# Patient Record
Sex: Female | Born: 1970 | Race: White | Hispanic: No | Marital: Married | Smoking: Current every day smoker
Health system: Southern US, Community
[De-identification: ages and names within clinical notes are randomized; demographics above are authoritative.]

## PROBLEM LIST (undated history)

## (undated) DIAGNOSIS — E119 Type 2 diabetes mellitus without complications: Secondary | ICD-10-CM

## (undated) DIAGNOSIS — E7800 Pure hypercholesterolemia, unspecified: Secondary | ICD-10-CM

## (undated) HISTORY — PX: ABDOMINAL HYSTERECTOMY: SHX81

## (undated) HISTORY — DX: Pure hypercholesterolemia, unspecified: E78.00

## (undated) HISTORY — DX: Type 2 diabetes mellitus without complications: E11.9

---

## 1997-12-10 ENCOUNTER — Other Ambulatory Visit: Admission: RE | Admit: 1997-12-10 | Discharge: 1997-12-10 | Payer: Self-pay | Admitting: Obstetrics and Gynecology

## 1998-12-28 ENCOUNTER — Other Ambulatory Visit: Admission: RE | Admit: 1998-12-28 | Discharge: 1998-12-28 | Payer: Self-pay | Admitting: Obstetrics and Gynecology

## 2000-08-01 ENCOUNTER — Other Ambulatory Visit: Admission: RE | Admit: 2000-08-01 | Discharge: 2000-08-01 | Payer: Self-pay | Admitting: Obstetrics and Gynecology

## 2001-10-04 ENCOUNTER — Other Ambulatory Visit: Admission: RE | Admit: 2001-10-04 | Discharge: 2001-10-04 | Payer: Self-pay | Admitting: Obstetrics and Gynecology

## 2001-10-11 ENCOUNTER — Ambulatory Visit (HOSPITAL_COMMUNITY): Admission: RE | Admit: 2001-10-11 | Discharge: 2001-10-11 | Payer: Self-pay | Admitting: Obstetrics and Gynecology

## 2001-10-22 ENCOUNTER — Inpatient Hospital Stay (HOSPITAL_COMMUNITY): Admission: AD | Admit: 2001-10-22 | Discharge: 2001-10-22 | Payer: Self-pay | Admitting: Obstetrics and Gynecology

## 2002-07-08 ENCOUNTER — Encounter: Payer: Self-pay | Admitting: Obstetrics and Gynecology

## 2002-07-08 ENCOUNTER — Encounter: Admission: RE | Admit: 2002-07-08 | Discharge: 2002-07-08 | Payer: Self-pay | Admitting: Obstetrics and Gynecology

## 2002-12-22 ENCOUNTER — Other Ambulatory Visit: Admission: RE | Admit: 2002-12-22 | Discharge: 2002-12-22 | Payer: Self-pay | Admitting: Obstetrics and Gynecology

## 2004-05-18 ENCOUNTER — Other Ambulatory Visit: Admission: RE | Admit: 2004-05-18 | Discharge: 2004-05-18 | Payer: Self-pay | Admitting: Obstetrics and Gynecology

## 2004-07-13 ENCOUNTER — Ambulatory Visit: Payer: Self-pay | Admitting: Cardiology

## 2004-10-11 ENCOUNTER — Ambulatory Visit: Payer: Self-pay | Admitting: Cardiology

## 2005-01-16 ENCOUNTER — Ambulatory Visit: Payer: Self-pay | Admitting: Cardiology

## 2005-02-08 ENCOUNTER — Ambulatory Visit: Payer: Self-pay | Admitting: Cardiology

## 2005-04-05 ENCOUNTER — Ambulatory Visit: Payer: Self-pay | Admitting: Cardiology

## 2005-06-15 ENCOUNTER — Other Ambulatory Visit: Admission: RE | Admit: 2005-06-15 | Discharge: 2005-06-15 | Payer: Self-pay | Admitting: Obstetrics and Gynecology

## 2005-07-26 ENCOUNTER — Ambulatory Visit: Payer: Self-pay | Admitting: Cardiology

## 2005-08-04 ENCOUNTER — Encounter: Payer: Self-pay | Admitting: Cardiology

## 2005-08-04 ENCOUNTER — Ambulatory Visit: Payer: Self-pay

## 2007-09-09 ENCOUNTER — Ambulatory Visit: Payer: Self-pay | Admitting: Cardiology

## 2007-10-07 ENCOUNTER — Encounter (INDEPENDENT_AMBULATORY_CARE_PROVIDER_SITE_OTHER): Payer: Self-pay | Admitting: Obstetrics and Gynecology

## 2007-10-07 ENCOUNTER — Ambulatory Visit (HOSPITAL_COMMUNITY): Admission: RE | Admit: 2007-10-07 | Discharge: 2007-10-08 | Payer: Self-pay | Admitting: Obstetrics and Gynecology

## 2007-10-21 ENCOUNTER — Ambulatory Visit: Payer: Self-pay | Admitting: Cardiology

## 2007-10-21 LAB — CONVERTED CEMR LAB
ALT: 28 units/L (ref 0–35)
AST: 23 units/L (ref 0–37)
Albumin: 3.4 g/dL — ABNORMAL LOW (ref 3.5–5.2)
Alkaline Phosphatase: 89 units/L (ref 39–117)
Bilirubin, Direct: 0.1 mg/dL (ref 0.0–0.3)
Cholesterol: 137 mg/dL (ref 0–200)
HDL: 26.8 mg/dL — ABNORMAL LOW (ref >39.0)
LDL Cholesterol: 96 mg/dL (ref 0–99)
Total Bilirubin: 0.4 mg/dL (ref 0.3–1.2)
Total CHOL/HDL Ratio: 5.1
Total Protein: 6.8 g/dL (ref 6.0–8.3)
Triglycerides: 73 mg/dL (ref 0–149)
VLDL: 15 mg/dL (ref 0–40)

## 2007-11-04 ENCOUNTER — Ambulatory Visit: Payer: Self-pay | Admitting: Cardiology

## 2007-11-14 ENCOUNTER — Ambulatory Visit (HOSPITAL_COMMUNITY): Admission: RE | Admit: 2007-11-14 | Discharge: 2007-11-14 | Payer: Self-pay | Admitting: Obstetrics and Gynecology

## 2008-03-04 ENCOUNTER — Ambulatory Visit: Payer: Self-pay | Admitting: Gastroenterology

## 2008-03-04 DIAGNOSIS — R1011 Right upper quadrant pain: Secondary | ICD-10-CM | POA: Insufficient documentation

## 2008-03-10 ENCOUNTER — Ambulatory Visit: Payer: Self-pay | Admitting: Gastroenterology

## 2008-04-02 ENCOUNTER — Ambulatory Visit: Payer: Self-pay | Admitting: Cardiology

## 2008-11-21 DIAGNOSIS — E1169 Type 2 diabetes mellitus with other specified complication: Secondary | ICD-10-CM | POA: Insufficient documentation

## 2008-11-21 DIAGNOSIS — N809 Endometriosis, unspecified: Secondary | ICD-10-CM | POA: Insufficient documentation

## 2008-11-21 DIAGNOSIS — E785 Hyperlipidemia, unspecified: Secondary | ICD-10-CM | POA: Insufficient documentation

## 2008-11-26 ENCOUNTER — Ambulatory Visit: Payer: Self-pay | Admitting: Cardiology

## 2008-11-26 ENCOUNTER — Encounter: Payer: Self-pay | Admitting: Cardiology

## 2008-11-30 ENCOUNTER — Ambulatory Visit: Payer: Self-pay | Admitting: Cardiology

## 2008-11-30 LAB — CONVERTED CEMR LAB
ALT: 20 units/L (ref 0–35)
AST: 23 units/L (ref 0–37)
Albumin: 3.7 g/dL (ref 3.5–5.2)
Alkaline Phosphatase: 95 units/L (ref 39–117)
Bilirubin, Direct: 0.1 mg/dL (ref 0.0–0.3)
Cholesterol: 117 mg/dL (ref 0–200)
HDL: 24.1 mg/dL — ABNORMAL LOW (ref >39.00)
Hgb A1c MFr Bld: 6.5 % (ref 4.6–6.5)
LDL Cholesterol: 82 mg/dL (ref 0–99)
Total Bilirubin: 0.6 mg/dL (ref 0.3–1.2)
Total CHOL/HDL Ratio: 5
Total Protein: 6.9 g/dL (ref 6.0–8.3)
Triglycerides: 56 mg/dL (ref 0.0–149.0)
VLDL: 11.2 mg/dL (ref 0.0–40.0)

## 2008-12-10 ENCOUNTER — Encounter (INDEPENDENT_AMBULATORY_CARE_PROVIDER_SITE_OTHER): Payer: Self-pay | Admitting: *Deleted

## 2009-03-04 ENCOUNTER — Encounter: Admission: RE | Admit: 2009-03-04 | Discharge: 2009-05-13 | Payer: Self-pay | Admitting: Specialist

## 2009-10-12 IMAGING — CT CT PELVIS W/O CM
2 of 4 series · 13 of 32 positions shown, 18 images · non-contrast
Comparison: None

CT ABDOMEN

CLINICAL DATA: evaluate for renal stones, ureteral obstuction, or
gallstones;

CT ABDOMEN AND PELVIS WITHOUT CONTRAST (CT UROGRAM)
TECHNIQUE: Multidetector CT imaging of the abdomen was performed
following the standard protocol without IV contrast.,Technique:
Multidetector CT imaging of the pelvis was performed following the
standard protocol without intravenous contrast.

[Series 2: abd pelvis · axial · 0.86mm/px · z∈[-459,-119]mm · 5 of 104 slices shown, 10 images]
[im 18/104  soft-tissue]
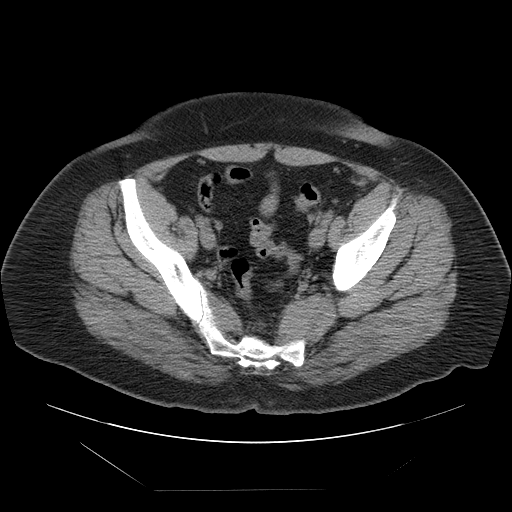
[im 18/104  bone]
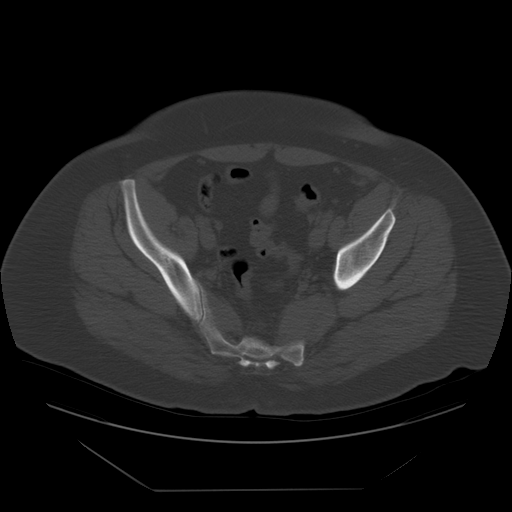
[im 35/104  soft-tissue]
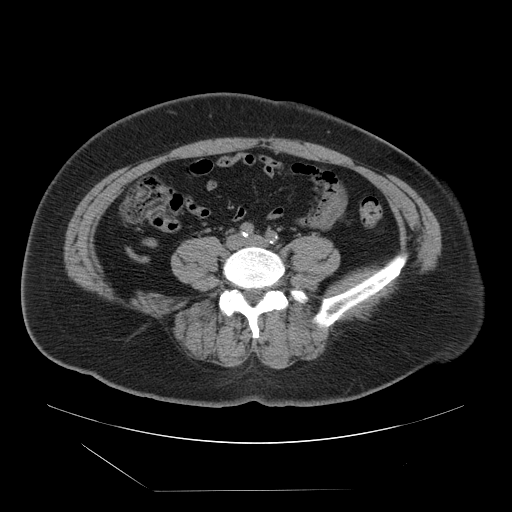
[im 35/104  lung]
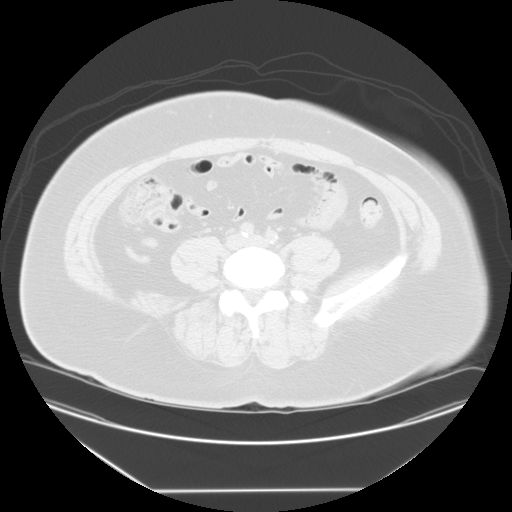
[im 52/104  soft-tissue]
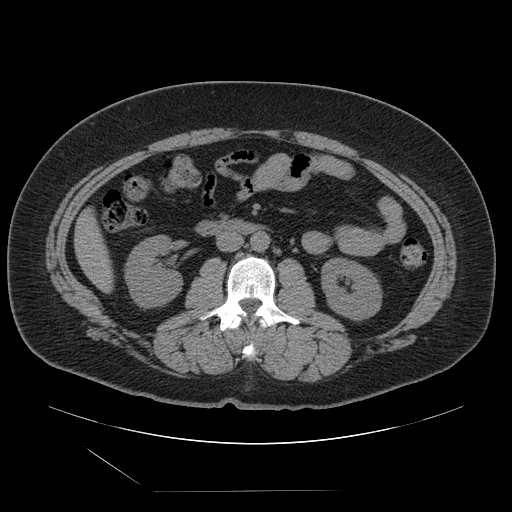
[im 52/104  lung]
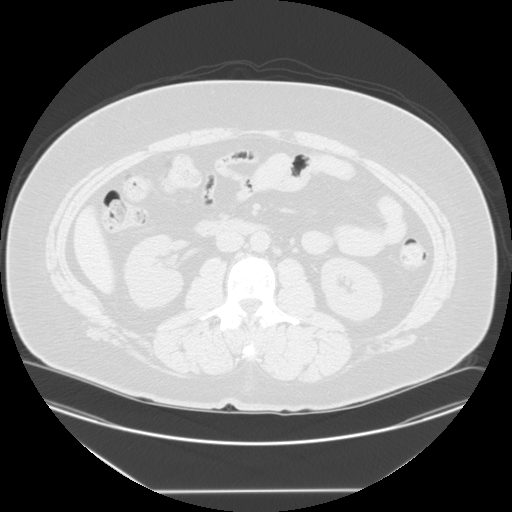
[im 69/104  soft-tissue]
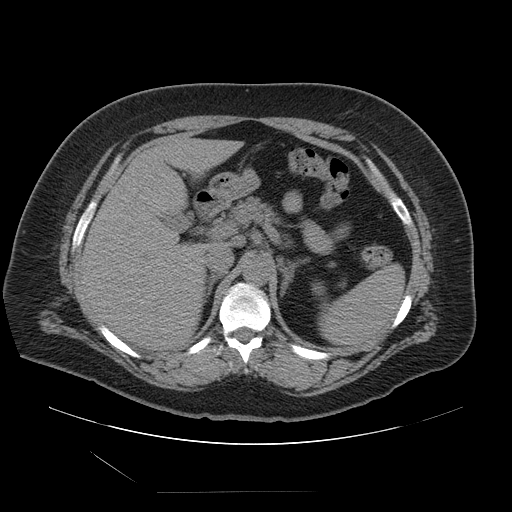
[im 69/104  lung]
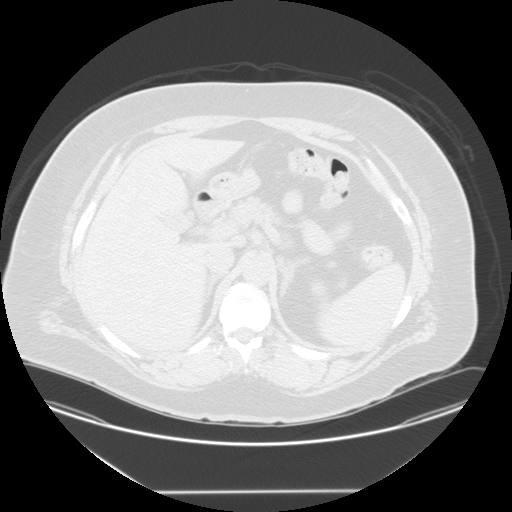
[im 86/104  soft-tissue]
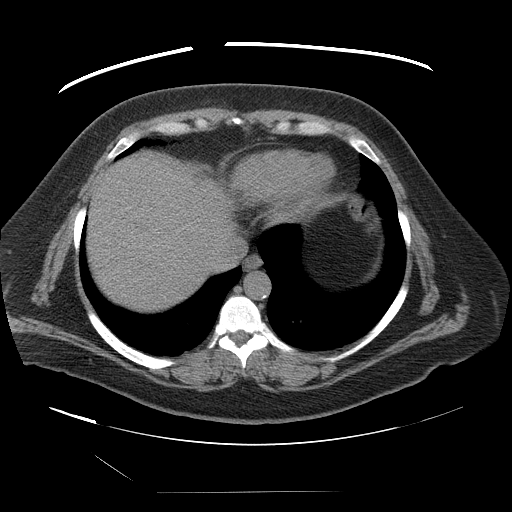
[im 86/104  lung]
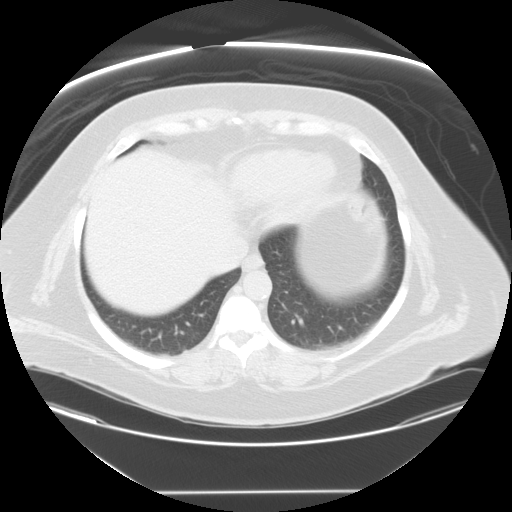

[Series 104: reformatted · sagittal · 0.98mm/px · 8 of 210 slices shown]
[im 17/210  soft-tissue]
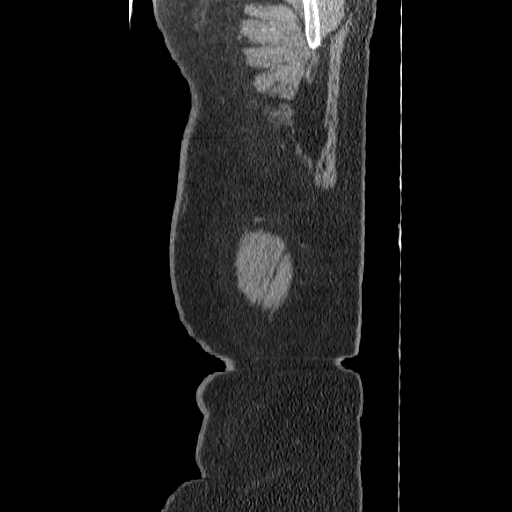
[im 49/210  soft-tissue]
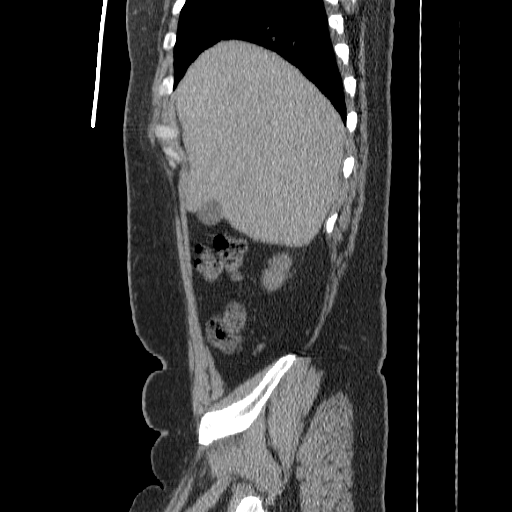
[im 65/210  soft-tissue]
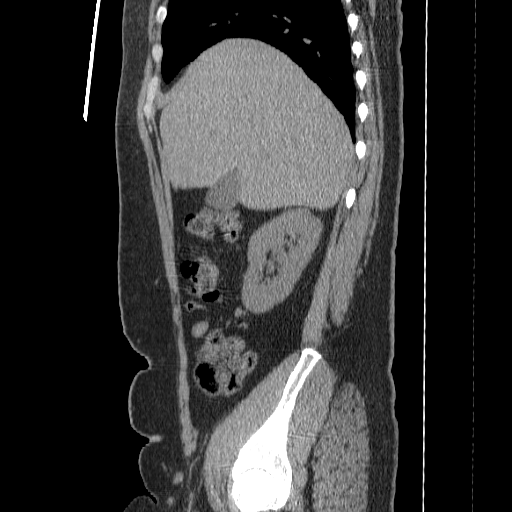
[im 97/210  soft-tissue]
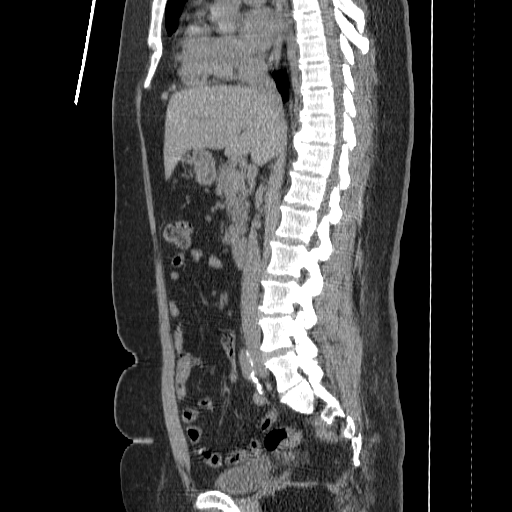
[im 113/210  soft-tissue]
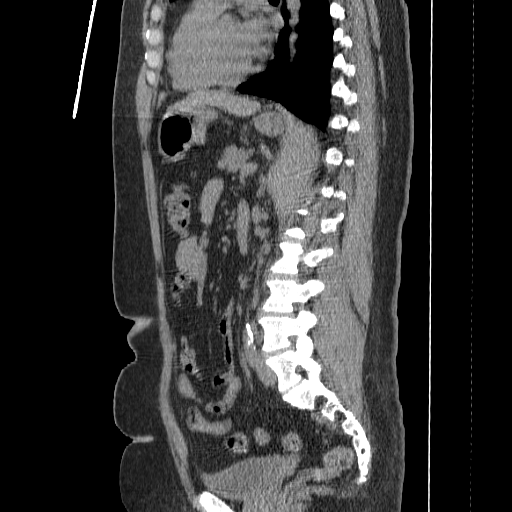
[im 145/210  soft-tissue]
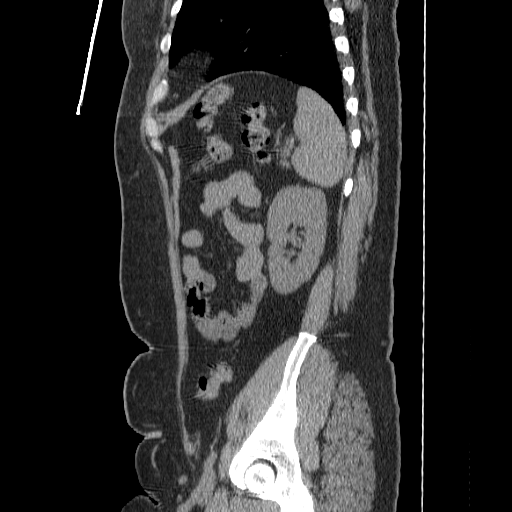
[im 161/210  soft-tissue]
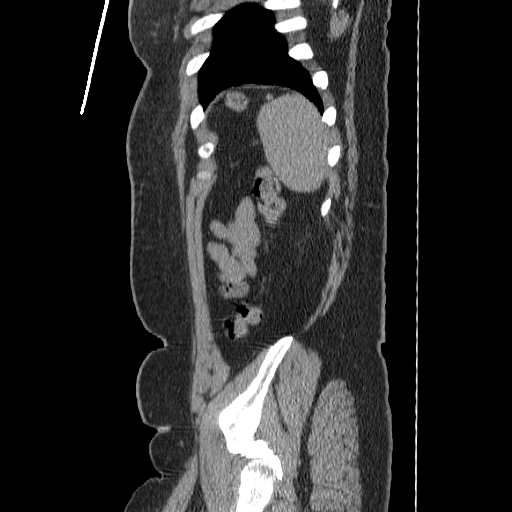
[im 193/210  soft-tissue]
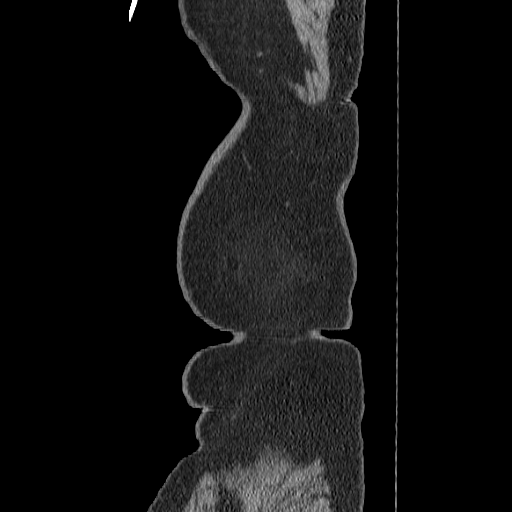

[13 of 32 positions shown; findings below may reference images not displayed]

FINDINGS: The lung bases are clear.

The liver is normal in attenuation and morphology.

Gallbladder is negative.  No stones are identified.

Spleen is normal.  The adrenal glands are normal.

The pancreas is normal.

Right kidney, and left kidney are both normal.  There is no
evidence for obstructive uropathy or nephrolithiasis.

No enlarged retroperitoneal or small bowel mesenteric lymph nodes
are identified.

The bowel loops of the upper abdomen are normal in their course and
caliber.  No obstruction.  The appendix is normal.

Left sided transitional type vertebra is identified at the
lumbosacral junction.  There is a pseudoarthrosis on the left,
which can often be a source of pain.
IMPRESSION: 1.  Negative for hydronephrosis or nephrolithiasis.

CT PELVIS
FINDINGS: There is no free fluid.

The urinary bladder is negative.

No evidence for hydroureter.

No enlarged pelvic or inguinal lymph nodes.

The pelvic bowel loops are unremarkable.

Review of bone windows is unremarkable.
IMPRESSION: 1.  No acute pelvic CT findings.

## 2009-10-12 IMAGING — US US ABDOMEN COMPLETE
1 series · 13 of 25 positions shown · non-contrast
Comparison: CT 11/14/2007

CLINICAL DATA: Right-sided abdominal pain.  Recent hysterectomy.

ABDOMEN ULTRASOUND
TECHNIQUE: Complete abdominal ultrasound examination was performed
including evaluation of the liver, gallbladder, bile ducts,
pancreas, kidneys, spleen, IVC, and abdominal aorta.

[Series 1: us abdomen complete · 0.33mm/px · 13 of 77 slices shown]
[im 1/77]
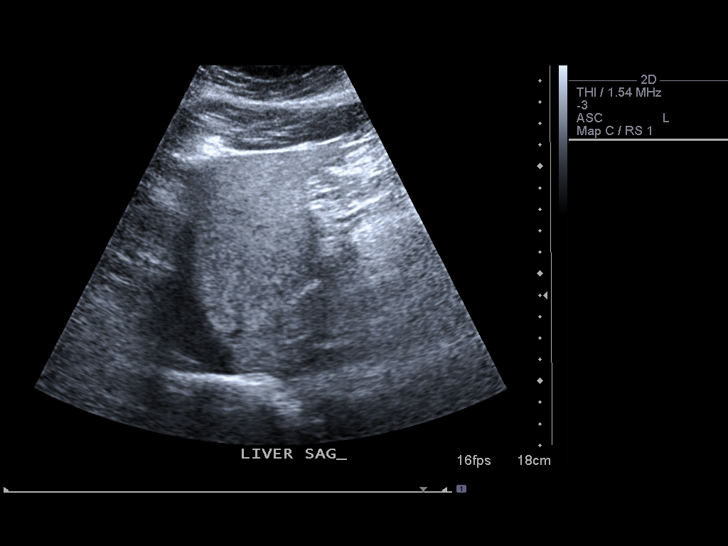
[im 7/77]
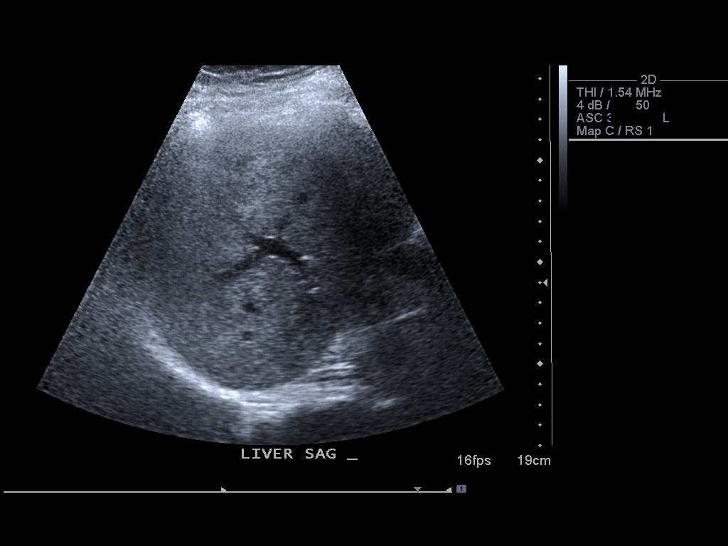
[im 13/77]
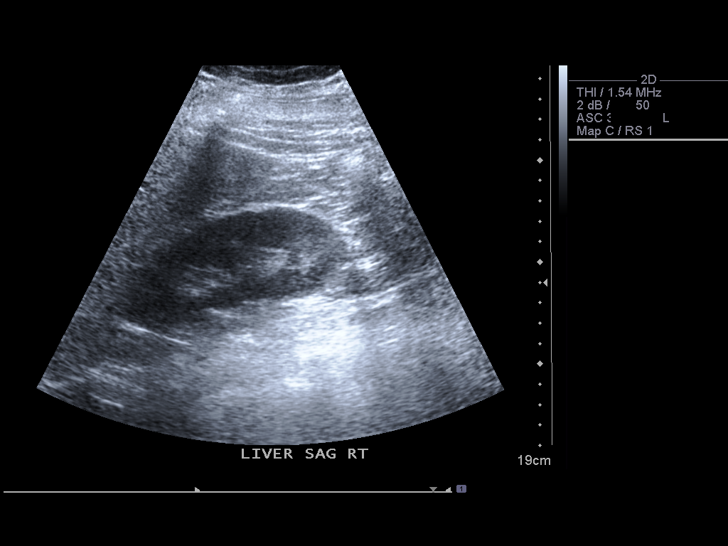
[im 20/77]
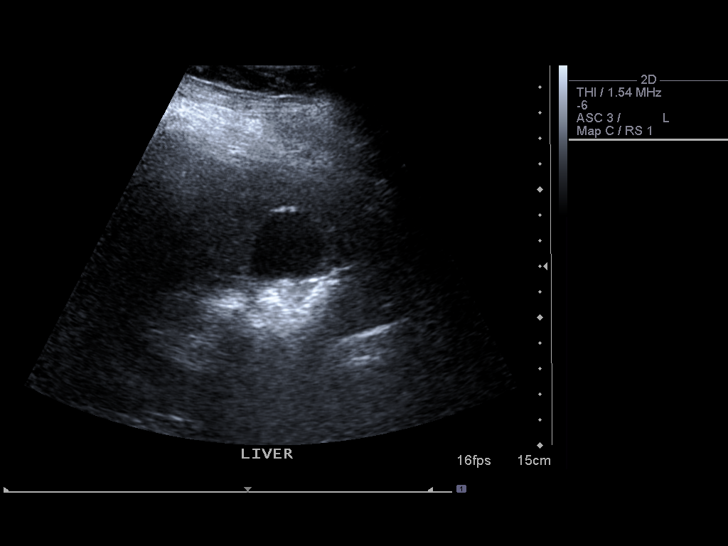
[im 26/77]
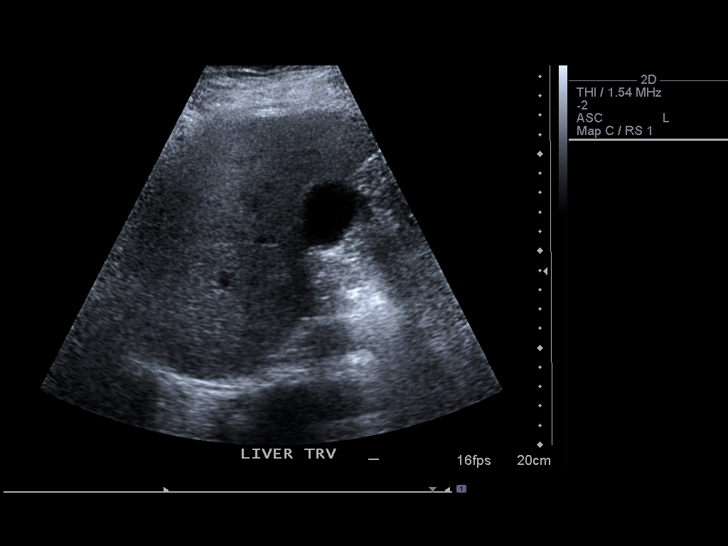
[im 32/77]
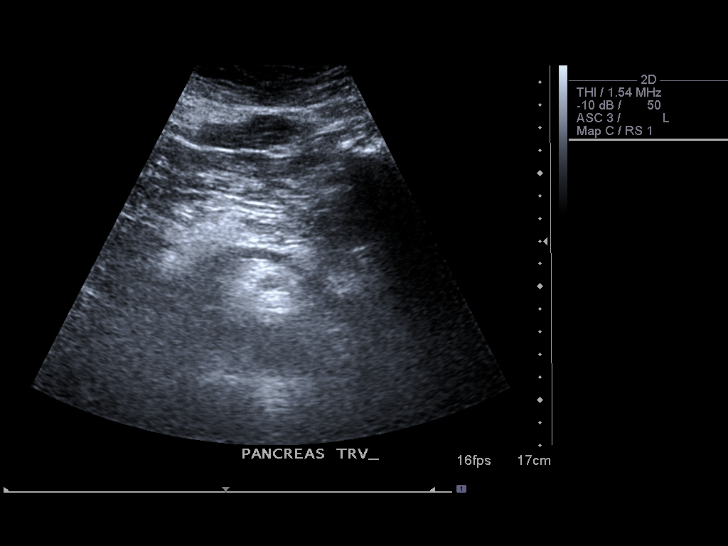
[im 39/77]
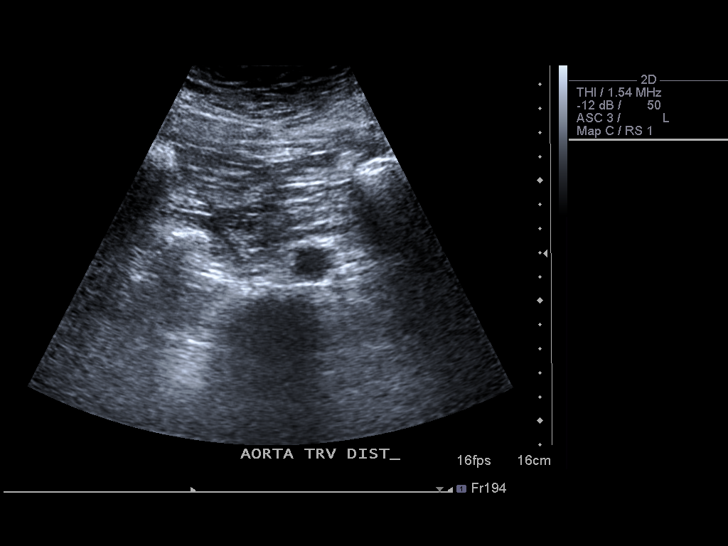
[im 45/77]
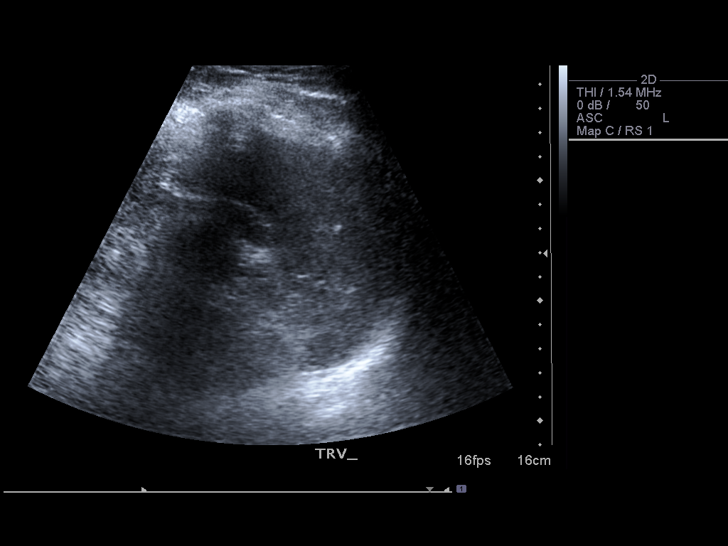
[im 51/77]
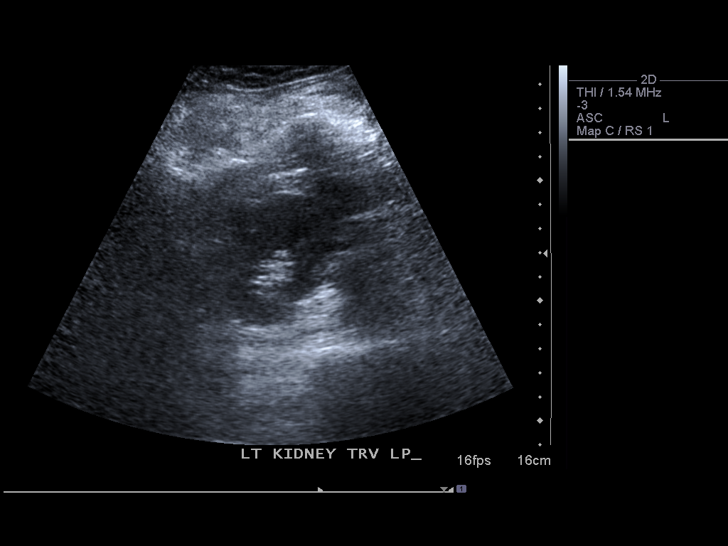
[im 58/77]
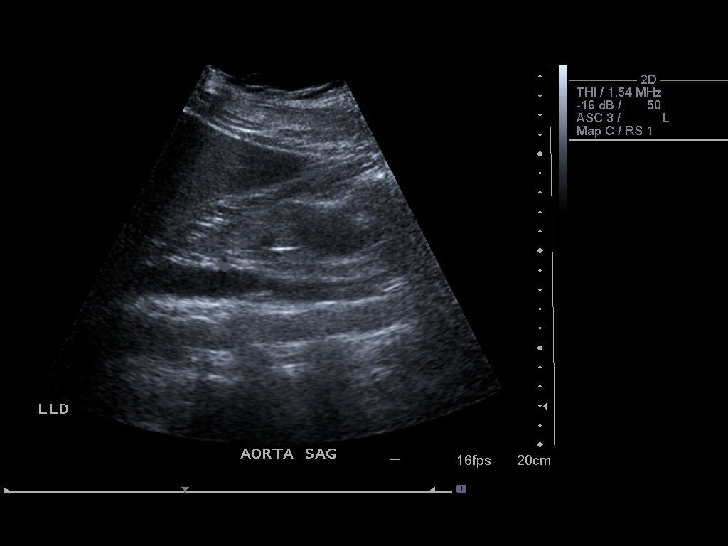
[im 64/77]
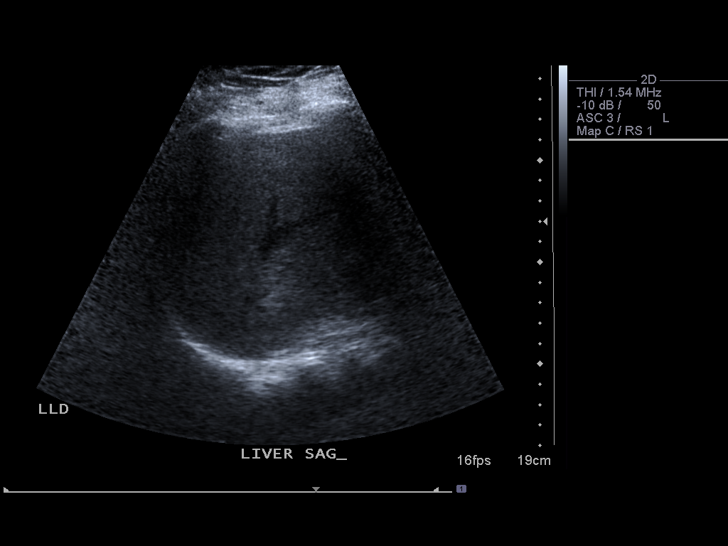
[im 70/77]
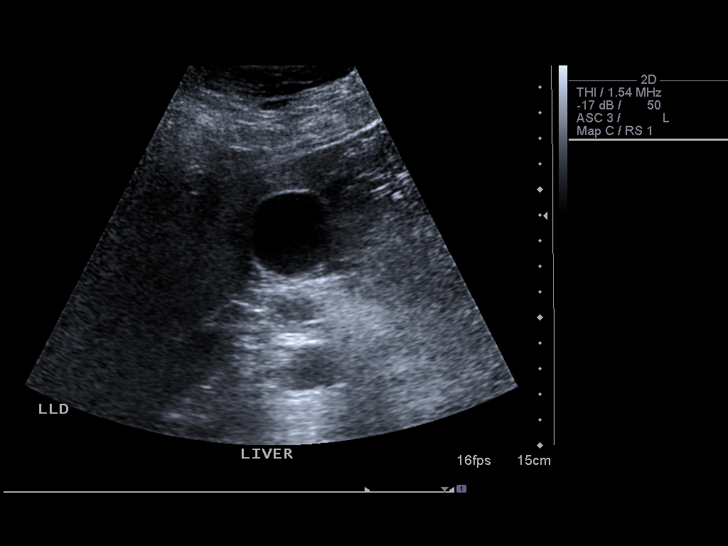
[im 77/77]
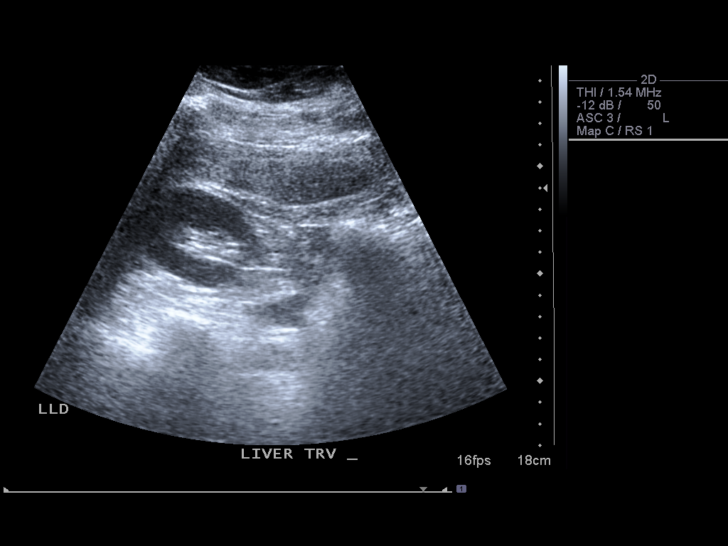

[13 of 25 positions shown; findings below may reference images not displayed]

FINDINGS: The liver parenchyma is homogeneous in echotexture
without evidence for focal parenchymal abnormality or intrahepatic
ductal dilatation.  The gallbladder is well distended and shows no
evidence for intraluminal stones or sludge.  No pericholecystic
fluid or gallbladder wall thickening is seen.  The common bile duct
is normal in size with an AP width of 3.5 mm.  The pancreas is seen
in its entirety and is echogenic compatible with fatty replacement.
No focal abnormality is seen.

Both kidneys have a normal appearance with the left kidney having a
sagittal length of 11.4 cm and the right kidney having a sagittal
length of 11.8 cm.  No hydronephrosis or focal parenchymal
abnormalities are seen.  The spleen has a normal appearance.

The abdominal aorta has a  AP width of 0.5 cm.  The proximal
inferior vena cava is within normal limits.
IMPRESSION: Normal abdominal ultrasound.

## 2009-11-02 ENCOUNTER — Telehealth: Payer: Self-pay | Admitting: Cardiology

## 2009-11-16 ENCOUNTER — Ambulatory Visit: Payer: Self-pay | Admitting: Cardiology

## 2009-11-16 DIAGNOSIS — F17200 Nicotine dependence, unspecified, uncomplicated: Secondary | ICD-10-CM | POA: Insufficient documentation

## 2010-09-11 LAB — CONVERTED CEMR LAB
ALT: 19 units/L (ref 0–35)
AST: 16 units/L (ref 0–37)
Albumin: 3.7 g/dL (ref 3.5–5.2)
Alkaline Phosphatase: 83 units/L (ref 39–117)
Bilirubin, Direct: 0.1 mg/dL (ref 0.0–0.3)
Cholesterol: 176 mg/dL (ref 0–200)
HDL: 32.1 mg/dL — ABNORMAL LOW (ref >39.00)
Hgb A1c MFr Bld: 6.4 % (ref 4.6–6.5)
LDL Cholesterol: 127 mg/dL — ABNORMAL HIGH (ref 0–99)
Total Bilirubin: 0.7 mg/dL (ref 0.3–1.2)
Total CHOL/HDL Ratio: 5
Total Protein: 6.9 g/dL (ref 6.0–8.3)
Triglycerides: 85 mg/dL (ref 0.0–149.0)
VLDL: 17 mg/dL (ref 0.0–40.0)

## 2010-09-15 NOTE — Progress Notes (Signed)
Summary: lab work prior to appt  Phone Note Call from Patient Call back at [facility] Phone [phone]   Caller: Patient Reason for Call: Talk to Nurse Summary of Call: does she need lab work prior to appt, sch for check up on 4/5 Initial call taken by: Migdalia Dk,  November 02, 2009 2:15 PM  Follow-up for Phone Call        N/A X1 [name], LPN  November 03, 2009 1:27 PM n/a x1 [name], LPN  November 04, 2009 9:13 AM  Additional Follow-up for Phone Call Additional follow up Details #1::        ok Additional Follow-up by: [name], MD, [facility],  November 04, 2009 10:02 AM     Appended Document: lab work prior to appt n/a x1 .cy    Appended Document: lab work prior to appt SPOKE WITH PT WILL FAST THE DAY OF APPT WITH DR [name] AND DO LABS AT THAT TIME .Marland Kitchen/CY

## 2010-09-15 NOTE — Assessment & Plan Note (Signed)
Summary: 1 yr f/u   Visit Type:  1 yr f/u Primary Provider:  Harold Hedge MD  CC:  pt c/o neck pain...edema/hands...denies any cp or sob.  History of Present Illness: Marisa Andrews returns today for followup concerning her mixed hyperlipidemia, obesity, and tobacco use.  She continues to smoke over a pack of cigarettes a day.She is making no significant effort to slow down.  She denies any orthopnea, PND or peripheral edema. She does have dyspnea on exertion has an atypical neck pain. The neck pain is not associated with exertion. She has nothing that sounds like angina or ischemic pain.  Current Medications (verified): 1)  Simvastatin 20 Mg  Tabs (Simvastatin) .... Take 1 Tablet By Mouth Once A Day 2)  Vivelle-Dot 0.1 Mg/24hr  Pttw (Estradiol) .... Change Patch Every 3 1/2 Days 3)  Multivitamins  Tabs (Multiple Vitamin) .Marland Kitchen.. 1 Once Daily  Allergies: 1)  ! * Tetanus  Past History:  Past Surgical History: Last updated: 03/04/2008 TAH/BSO 2009 Laparoscopic endometriosis surgery 2003  Review of Systems       negative other than history of present illness  Vital Signs:  Patient profile:   40 year old female Height:      67 inches Weight:      249 pounds BMI:     39.14 Pulse rate:   85 / minute Pulse rhythm:   regular BP sitting:   126 / 80  (left arm) Cuff size:   large  Vitals Entered By: Danielle Rankin, CMA (November 16, 2009 11:10 AM)  Physical Exam  General:  obese.  obese.   Head:  normocephalic and atraumatic Eyes:  PERRLA/EOM intact; conjunctiva and lids normal. Neck:  Neck supple, no JVD. No masses, thyromegaly or abnormal cervical nodes. Chest Ysmael Hires:  no deformities or breast masses noted Lungs:  Clear bilaterally to auscultation and percussion. Heart:  Non-displaced PMI, chest non-tender; regular rate and rhythm, S1, S2 without murmurs, rubs or gallops. Carotid upstroke normal, no bruit. Normal abdominal aortic size, no bruits. Femorals normal pulses, no bruits.  Pedals normal pulses. No edema, no varicosities. Abdomen:  Bowel sounds positive; abdomen soft and non-tender without masses, organomegaly, or hernias noted. No hepatosplenomegaly. Msk:  Back normal, normal gait. Muscle strength and tone normal. Pulses:  pulses normal in all 4 extremities Extremities:  No clubbing or cyanosis. Neurologic:  Alert and oriented x 3. Skin:  Intact without lesions or rashes. Psych:  Normal affect.   EKG  Procedure date:  11/16/2009  Findings:      normal sinus rhythm, normal EKG  Impression & Recommendations:  Problem # 1:  HYPERLIPIDEMIA-MIXED (ICD-272.4) I will obtain fasting lipids and liver today. Her updated medication list for this problem includes:    Simvastatin 20 Mg Tabs (Simvastatin) .Marland Kitchen... Take 1 tablet by mouth once a day  Orders: EKG w/ Interpretation (93000) TLB-Lipid Panel (80061-LIPID)  Problem # 2:  OBESITY-MORBID (>100') (ICD-278.01) Assessment: Unchanged Will check hemoglobin A1c today. Patient counseled once again about weight loss. Orders: TLB-A1C / Hgb A1C (Glycohemoglobin) (83036-A1C)  Problem # 3:  TOBACCO ABUSE (ICD-305.1) Assessment: Unchanged patient counseled once again to stop shortly to moderate.  Other Orders: TLB-Hepatic/Liver Function Pnl (80076-HEPATIC)  Patient Instructions: 1)  Your physician recommends that you schedule a follow-up appointment in: YEAR WITH DR Sundance Moise 2)  Your physician recommends that you return for lab work UJ:WJXBJ LIPID LIVER HGBA1C 3)  Your physician recommends that you continue on your current medications as directed. Please refer  to the Current Medication list given to you today. 4)  Your physician discussed the risks, benefits and indications for preventive aspirin therapy. It is recommended that you start (or continue) taking 81 mg of aspirin a day. 5)  Your physician encouraged you to lose weight for better health. Prescriptions: SIMVASTATIN 20 MG  TABS (SIMVASTATIN) Take 1 tablet  by mouth once a day  #30 x 11   Entered by:   [name], LPN   Authorized by:   [name], MD, [facility]   Signed by:   [name], LPN on 16/05/9603   Method used:   Electronically to        [facility]  [address] (retail)       [address]       [place], [place]  [address]       Ph: [phone]       Fax: [phone]   RxID:   [id number]

## 2010-12-27 NOTE — Assessment & Plan Note (Signed)
[facility]                            CARDIOLOGY OFFICE NOTE   Marisa, Andrews                       MRN:          [id number]  DATE:09/09/2007                            DOB:          1971/02/21    Marisa Andrews returns to see me today per the request of Dr. Harold Hedge.  I saw her initially back in 2006. She initially lost 28 pounds but she  has gained this back.   She presented with severe mixed hyperlipidemia with very high particles.  She also had ongoing tobacco use.  She had been fighting her weight for  a number of years.   We started her on Vytorin 10/20.   She had an excellent response to this with her LDL dropping to 61, total  cholesterol dropped to 94.  Her HDL remains low at 21, triglycerides  were low.  LFTs were normal.   She had a tough year last year.  She stopped her Vytorin. She also says  that when she walks or does anything that makes her sweats she breaks  out in a rash.   She is currently on no cardiovascular meds.   She saw Dr. Henderson Cloud recently who repeated her lipids.  Her total  cholesterol was 208, LDL was 157, HDL was 34, triglycerides 87, VLDL is  only 17, thyroid was normal.   She has some mild dyspnea on exertion but no angina.   Her blood pressure today is 152/88, her pulse is 93 and regular.  HEENT:  Normocephalic, atraumatic.  PERRL.  Extraocular is intact.  Sclera  clear.  Facial symmetry is normal.  Carotids upstrokes were  equal bilaterally without bruits, no JVD.  Thyroid is not enlarged.  Trachea is midline.  LUNGS:  Clear.  HEART:  Reveals a poorly appreciated PMI.  She has normal S1 and S2  without gallop.  ABDOMEN:  Obese with good bowel sounds.  Organomegaly can not be  assessed.  EXTREMITIES:  No edema.  Pulses are intact.  NEUROLOGIC:  Intact.   EKG shows sinus rhythm with a rightward axis with some nonspecific  changes inferiorly.   ASSESSMENT/PLAN:  I have had about a 30 minute  discussion with Marisa Andrews  today.  I have made the following recommendations and observations with  her.  1. She will not lose weight by exercising.  Even when she was      exercising she only walking three miles a week.  I told her this      equated to about 900 calories which is 1/4 of what 1 pound with be.  2. She may have a general intolerance to sweating with the rash.  This      presents all kinds of problems as we discussed today. She was quite      humorous about this as well.  3. Begin simvastatin 20 mg q.h.s.  I suspect this will obtain the      lipid levels we want knowing her history with Vytorin. This will      also save her some money.  4. Blood pressure  may be a major problem.  We need to follow-up with      her in about 6 weeks with blood work and with her blood pressure.  5. Stop smoking.  I told her that realistically she probably would      not, but it would helped significantly.   All in all it would be great if she would just take her medication and  follow-up on her blood pressure.  I will see her back in 6 weeks.     Thomas C. Daleen Squibb, MD, [facility]  Electronically Signed    TCW/MedQ  DD: 09/09/2007  DT: 09/10/2007  Job #: [id number]   cc:   Guy Sandifer. Henderson Cloud, M.D.

## 2010-12-27 NOTE — Assessment & Plan Note (Signed)
[facility]                            CARDIOLOGY OFFICE NOTE   HANIYYAH, SAKUMA                       MRN:          [id number]  DATE:11/04/2007                            DOB:          27-Nov-1970    Ms. Albino returns today for further management of the following issues:  1. Hypertension.  Her blood pressure was 152/88 on her last visit,      September 09, 2007.  2. Obesity.  Her weight is identical today as it was then at 254.  She      just had a hysterectomy in February 2009 and has not gotten back      into a walking program.  3. Sedentary lifestyle.  4. Hyperlipidemia, on simvastatin.  Her last lipids showed a total      cholesterol of 137, triglycerides 77, HDL 26.8, LDL 96, total      cholesterol/HDL ratio 5.1.  LFTs were normal.  5. Tobacco use.  She is down to 15 cigarettes a day from 20.   She has no chest pain, nausea, vomiting, palpitations, presyncope, or  syncope, or any symptoms of congestive heart failure.  She does have  some dyspnea on exertion.  This is baseline.   MEDICATIONS:  1. Simvastatin 20 mg p.o. at bedtime.  2. Vivelle Dot 1 mg patch every third day.   PHYSICAL EXAMINATION:  VITAL SIGNS:  Her blood pressure today is 136/84,  pulse is 88 and regular.  Weight is 254.  HEENT:  Unchanged.  NECK:  Carotid upstrokes were equal bilaterally, without bruits, no JVD.  Thyroid is not enlarged.  Trachea is midline.  LUNGS:  Clear.  HEART:  Reveals a poorly appreciated PMI.  Normal S1, S2, without  gallop.  ABDOMEN:  Protuberant, with good bowel sounds. Obesity precludes  assessment of organomegaly.  EXTREMITIES;  With no cyanosis, clubbing, or edema.  Pulses are brisk,  both dorsalis pedis and posterior tibial.  There is no sign of DVT.  She  has a few venous varicosities.  NEUROLOGIC:  Intact.  SKIN:  Unremarkable.   Fortunately, Ms. Boccio brought her husband today.  We had a long talk,  greater than 20 minutes,  answering any questions about therapeutic  lifestyle changes and their positive impact on cardiovascular events  down the road.   RECOMMENDATIONS:  1. Continue simvastatin 20 mg q.h.s.  2. Get back into a walking program 3 hours a week.  3. Weight loss of 4 pounds per month if possible.  4. Watch blood pressure.  5. Decrease smoking to 10 per day over the next 2 weeks, and then      hopefully down to about 5.   She is not interested in Chantix at this point.   I will see her back again in 3 months for accountability.     Thomas C. Daleen Squibb, MD, [facility]  Electronically Signed    TCW/MedQ  DD: 11/04/2007  DT: 11/04/2007  Job #: [id number]   cc:   Guy Sandifer. Henderson Cloud, M.D.

## 2010-12-27 NOTE — Op Note (Signed)
Marisa Andrews, Marisa Andrews                ACCOUNT NO.:  [id number]   MEDICAL RECORD NO.:  [id number]          PATIENT TYPE:  OIB   LOCATION:  9309                          FACILITY:  [facility]   PHYSICIAN:  Guy Sandifer. Henderson Cloud, M.D. DATE OF BIRTH:  1970-08-25   DATE OF PROCEDURE:  10/07/2007  DATE OF DISCHARGE:                               OPERATIVE REPORT   PREOPERATIVE DIAGNOSIS:  Menorrhagia.   POSTOPERATIVE DIAGNOSIS:  Menorrhagia.   PROCEDURE:  Laparoscopically-assisted vaginal hysterectomy with  bilateral salpingo-oophorectomy.   SURGEON:  Harold Hedge, MD.   ASSISTANT:  Richarda Overlie, MD.   ANESTHESIA:  General with endotracheal intubation.   SPECIMEN:  Uterus, bilateral tubes and ovaries to pathology.   ESTIMATED BLOOD LOSS:  150 mL.   INDICATIONS AND CONSENT:  This patient is a 40 year old, married, white  female with heavy painful menses.  After discussion of alternatives, she  is being admitted for surgical management.  She also has a family  history of ovarian cancer in her mother.  After discussion of options,  she requests laparoscopically-assisted vaginal hysterectomy and  bilateral salpingo-oophorectomy.  The potential risks and complications  have been discussed preoperatively including but limited to infection,  organ damage, bleeding requiring transfusion of blood products with  possible transfusion reaction, HIV and hepatitis acquisition, DVT, PE,  pneumonia, fistula formation, postoperative dyspareunia. The issues of  menopause have also been discussed.  All questions have been answered  and consent of consent is signed on the chart.   FINDINGS:  Her upper abdomen is grossly normal.  Appendix is normal.  Uterus is normal in size and contour.  Tubes and ovaries normal  bilaterally.   DESCRIPTION OF PROCEDURE:  The patient was taken to operating room where  she is identified, placed in the dorsosupine position and general  anesthesia is induced via endotracheal  intubation.  She is then placed  in the dorsal lithotomy position where she is prepped abdominally and  vaginally, bladder straight catheterized.  A Hulka tenaculum is placed  in the uterus as a manipulator and she is draped in a sterile fashion.  The infraumbilical and suprapubic area is injected in the midline with  1/2% plain Marcaine.  A small infraumbilical incision is made and a  disposable Veress needle is placed on the first attempt without  difficulty.  A normal syringe and drop test are noted.  2 liters of gas  are then insufflated under low pressure with good tympany in the right  upper quadrant.  The Veress needle is removed and a 10/11 XL bladeless  disposable trocar sleeve was placed using direct visualization with the  diagnostic laparoscope. After placement the operative laparoscope is  placed.  A small suprapubic incision is made in the midline and a 5-mm  XL bladeless disposable trocar sleeve is placed under direct  visualization without difficulty.  The above findings are noted.  The  course of the ureters is identified bilaterally and seen to be well  clear of the area of surgery.  Then using the gyrus bipolar cautery  cutting instrument, the right infundibulopelvic ligament is  taken down,  carried across the round ligament down to the level of the vesicouterine  peritoneum.  A similar procedure is carried out on the left side.  Good  hemostasis is noted.  The vesicouterine peritoneum is taken down  cephalolaterally sharply.  The suprapubic trocar sleeve is removed,  instruments are removed and attention is turned to the vagina.  The  posterior cul-de-sac is entered sharply and the cervix is circumscribed  with unipolar cautery.  The mucosa is advanced sharply and bluntly.  Then using the gyrus bipolar cautery instrument, the uterosacral  ligaments are taken down bilaterally.  This was followed by the bladder  pillars, cardinal ligaments and uterine vessels bilaterally.   The  anterior cul-de-sac is entered during this process without difficulty.  The fundus is delivered posteriorly, proximal ligaments are taken down  and the entire specimen is delivered with tubes and ovaries.  There is a  single bleeder which is identified and controlled with a right angle  clamp and then ligated with a free tie.  Good hemostasis is noted.  The  uterosacral ligaments are then plicated in the vaginal cuff bilaterally  with separate sutures of #0 Monocryl.  All suture will be #0 Monocryl  unless otherwise designated.  The uterosacral ligaments are then  plicated in the midline with a third suture. The cuff is closed with  figure-of-eights.  A Foley catheter is placed in the bladder to drain  and clear urine is noted.  Attention is returned to the abdomen.  Pneumoperitoneum is reintroduced and the suprapubic trocar sleeve is  reinserted under direct visualization without difficulty.  Copious  irrigation is carried out.  Minor bleeders in the peritoneal edges are  controlled with bipolar cautery.  Inspection under reduced  pneumoperitoneum reveals good hemostasis.  Excess fluid is removed.  All  instruments and trocar sleeves are removed. The skin incisions were  closed with interrupted 3-0 Vicryl suture.  Dermabond is applied to both  incisions as well. All counts are correct.  The patient is awakened and  taken to the recovery room in stable condition.      Guy Sandifer Henderson Cloud, M.D.  Electronically Signed     JET/MEDQ  D:  10/07/2007  T:  10/07/2007  Job:  [id number]

## 2010-12-27 NOTE — Assessment & Plan Note (Signed)
[facility]                            CARDIOLOGY OFFICE NOTE   Marisa Andrews, Marisa Andrews                       MRN:          [id number]  DATE:04/02/2008                            DOB:          1971-02-07    Marisa Andrews returns today for further management of her mixed  hyperlipidemia, tobacco use, and obesity.   She has not lost any weight, but has not gained any.  She weighs 252.  She is compliant with the medications.  Her lipids looked good in March  except for a low HDL, which has been a chronic problem.  She continues  to smoke about half-pack cigarettes a day.   MEDICATIONS:  1. Simvastatin 20 mg nightly.  2. Vivelle-Dot 1 mg patch.  3. Nexium 40 mg a day.   PHYSICAL EXAMINATION:  VITAL SIGNS:  Her blood pressure is 138/90, pulse  96 and regular, and weight is 252.  HEENT:  Unchanged.  NECK:  Carotid upstrokes were equal bilaterally without bruits.  No JVD.  Thyroid is not enlarged.  Trachea is midline.  LUNGS:  Clear.  HEART:  Reveals a poorly appreciated PMI.  Normal S1 and S2.  No gallop.  ABDOMEN:  Soft.  Good bowel sounds.  No midline bruits.  EXTREMITIES:  No cyanosis, clubbing, or edema.  Pulses are intact.  NEURO:  Intact.   Marisa Andrews is doing about the same.  Unfortunately, she continues to  smoke and lose weight.  I believe she is on a statin and her numbers  looked pretty good, except for a low HDL.   I have reinforced all the things.  We talked about her previous visits.  I will see her back again in March 2010.  She will need blood work at  that time.  We will probably check her hemoglobin A1c at that time as  well.      Thomas C. Daleen Squibb, MD, [facility]  Electronically Signed    TCW/MedQ  DD: 04/02/2008  DT: 04/03/2008  Job #: [id number]

## 2010-12-30 NOTE — Op Note (Signed)
[facility]  Patient:    Marisa Andrews, Marisa Andrews Visit Number: [id number] MRN: [id number]          Service Type: DSU Location: [facility] [address] Attending Physician:  Soledad Gerlach Dictated by:   Guy Sandifer Arleta Creek, M.D. Proc. Date: 10/11/01 Admit Date:  10/11/2001                             Operative Report  PREOPERATIVE DIAGNOSIS:       Pelvic pain.  POSTOPERATIVE DIAGNOSIS:      Pelvic adhesions.  PROCEDURE:                    Laparoscopy with lysis of adhesions.  SURGEON:                      Guy Sandifer. Arleta Creek, M.D.  ANESTHESIA:                   General with endotracheal intubation.  ESTIMATED BLOOD LOSS:         Drops.  INDICATIONS AND CONSENT:      The patient is a 40 year old married white female with increasing pelvic pain and dysmenorrhea.  After a discussion of the options, laparoscopy was discussed.  The potential risks and complications were then discussed including (but not limited to) infection; bowel, bladder or ureteral damage; bleeding requiring transfusion of blood products with possible transfusion reaction, HIV and hepatitis acquisition; DVT; PE and pneumonia.  All questions were answered and consent was signed on the chart.  FINDINGS:                     The upper abdomen was grossly normal.  In the pelvis, the uterus, anterior and posterior cul-de-sacs were normal.  The tubes and ovaries were normal bilaterally.  There were adhesions from the sigmoid epiploica to the left infundibulopelvic ligament immediately lateral to the left ovary.  There were also adhesions to the left pelvic brim.  DESCRIPTION OF PROCEDURE:     The patient was taken to the operating room and placed in the dorsal supine position, where general anesthesia was induced via endotracheal intubation.  She was then placed in the dorsal lithotomy position, where she was prepped abdominal and vaginally.  The bladder was straight catheterized.  A Hulka  tenaculum was placed in the uterus as a manipulator and she was draped in a sterile fashion.  A small infraumbilical incision was made and a 10-11 disposable trocar sleeve was placed on the first attempt without difficulty.  Placement was verified with the laparoscope.  No damage to surrounding structures was noted.  Pneumoperitoneum was induced.  A small suprapubic incision was made and a 5 mm nondisposable trocar sleeve was placed under direct visualization without difficulty.  The above findings were noted.  Using sharp dissection, the adhesions of the left infundibulopelvic ligament and left pelvic side wall were taken down without difficulty. Irrigation was carried out.  A small amount of bipolar cautery was used to obtain complete hemostasis.  INTERCEED was then backloaded through the laparoscope and placed across the area of surgery.  It was slightly moistened and excess fluid was removed.  Good hemostasis was noted.  Pneumoperitoneum was reduced.  The suprapubic trocar sleeve was removed and continue hemostasis was noted.  Pneumoperitoneum was completely reduced.  The umbilical trocar sleeve was removed.  The subcutaneous layers of the  umbilical incision were closed with 2-0 Vicryl suture, with care being taken not to pick up any underlying structures.  The incisions were injected with 0.5% plain Marcaine. Dermabond was then placed on the skin to close the incisions.  The Hulka tenaculum was removed.  Good hemostasis was noted.  All counts were correct. The patient was awakened and taken to the recovery room in stable condition. Dictated by:   Guy Sandifer Arleta Creek, M.D. Attending Physician:  Soledad Gerlach DD:  10/11/01 TD:  10/11/01 Job: [id number]

## 2011-05-05 LAB — CBC
HCT: 33.9 — ABNORMAL LOW
Hemoglobin: 14.2
MCHC: 34.5
MCV: 88.8
Platelets: 255
RBC: 4.66
WBC: 14.8 — ABNORMAL HIGH

## 2011-05-05 LAB — COMPREHENSIVE METABOLIC PANEL
ALT: 21
CO2: 27
Calcium: 9
GFR calc non Af Amer: >60
Glucose, Bld: 112 — ABNORMAL HIGH
Sodium: 141

## 2014-03-23 ENCOUNTER — Encounter: Payer: Self-pay | Admitting: Gastroenterology

## 2014-12-16 ENCOUNTER — Other Ambulatory Visit: Payer: Self-pay | Admitting: Obstetrics and Gynecology

## 2014-12-17 LAB — CYTOLOGY - PAP

## 2015-11-23 DIAGNOSIS — E784 Other hyperlipidemia: Secondary | ICD-10-CM | POA: Diagnosis not present

## 2015-11-23 DIAGNOSIS — E119 Type 2 diabetes mellitus without complications: Secondary | ICD-10-CM | POA: Diagnosis not present

## 2015-11-23 DIAGNOSIS — I10 Essential (primary) hypertension: Secondary | ICD-10-CM | POA: Diagnosis not present

## 2015-11-23 DIAGNOSIS — Z6836 Body mass index (BMI) 36.0-36.9, adult: Secondary | ICD-10-CM | POA: Diagnosis not present

## 2015-12-27 DIAGNOSIS — R55 Syncope and collapse: Secondary | ICD-10-CM | POA: Diagnosis not present

## 2015-12-27 DIAGNOSIS — Z7984 Long term (current) use of oral hypoglycemic drugs: Secondary | ICD-10-CM | POA: Diagnosis not present

## 2015-12-27 DIAGNOSIS — Z7982 Long term (current) use of aspirin: Secondary | ICD-10-CM | POA: Diagnosis not present

## 2015-12-27 DIAGNOSIS — F17210 Nicotine dependence, cigarettes, uncomplicated: Secondary | ICD-10-CM | POA: Diagnosis not present

## 2015-12-27 DIAGNOSIS — E119 Type 2 diabetes mellitus without complications: Secondary | ICD-10-CM | POA: Diagnosis not present

## 2016-02-23 DIAGNOSIS — E119 Type 2 diabetes mellitus without complications: Secondary | ICD-10-CM | POA: Diagnosis not present

## 2016-02-23 DIAGNOSIS — Z0000 Encounter for general adult medical examination without abnormal findings: Secondary | ICD-10-CM | POA: Diagnosis not present

## 2016-02-28 DIAGNOSIS — Z6837 Body mass index (BMI) 37.0-37.9, adult: Secondary | ICD-10-CM | POA: Diagnosis not present

## 2016-02-28 DIAGNOSIS — Z01419 Encounter for gynecological examination (general) (routine) without abnormal findings: Secondary | ICD-10-CM | POA: Diagnosis not present

## 2016-02-28 DIAGNOSIS — Z1231 Encounter for screening mammogram for malignant neoplasm of breast: Secondary | ICD-10-CM | POA: Diagnosis not present

## 2016-02-29 DIAGNOSIS — I10 Essential (primary) hypertension: Secondary | ICD-10-CM | POA: Diagnosis not present

## 2016-02-29 DIAGNOSIS — Z1389 Encounter for screening for other disorder: Secondary | ICD-10-CM | POA: Diagnosis not present

## 2016-02-29 DIAGNOSIS — E119 Type 2 diabetes mellitus without complications: Secondary | ICD-10-CM | POA: Diagnosis not present

## 2016-02-29 DIAGNOSIS — Z0000 Encounter for general adult medical examination without abnormal findings: Secondary | ICD-10-CM | POA: Diagnosis not present

## 2016-02-29 DIAGNOSIS — E784 Other hyperlipidemia: Secondary | ICD-10-CM | POA: Diagnosis not present

## 2016-02-29 DIAGNOSIS — Z6836 Body mass index (BMI) 36.0-36.9, adult: Secondary | ICD-10-CM | POA: Diagnosis not present

## 2016-05-18 DIAGNOSIS — Z23 Encounter for immunization: Secondary | ICD-10-CM | POA: Diagnosis not present

## 2016-05-31 DIAGNOSIS — Z6836 Body mass index (BMI) 36.0-36.9, adult: Secondary | ICD-10-CM | POA: Diagnosis not present

## 2016-05-31 DIAGNOSIS — E784 Other hyperlipidemia: Secondary | ICD-10-CM | POA: Diagnosis not present

## 2016-05-31 DIAGNOSIS — E119 Type 2 diabetes mellitus without complications: Secondary | ICD-10-CM | POA: Diagnosis not present

## 2016-05-31 DIAGNOSIS — I10 Essential (primary) hypertension: Secondary | ICD-10-CM | POA: Diagnosis not present

## 2016-10-04 DIAGNOSIS — E119 Type 2 diabetes mellitus without complications: Secondary | ICD-10-CM | POA: Diagnosis not present

## 2016-10-04 DIAGNOSIS — Z6836 Body mass index (BMI) 36.0-36.9, adult: Secondary | ICD-10-CM | POA: Diagnosis not present

## 2016-10-04 DIAGNOSIS — I10 Essential (primary) hypertension: Secondary | ICD-10-CM | POA: Diagnosis not present

## 2017-02-18 DIAGNOSIS — H60392 Other infective otitis externa, left ear: Secondary | ICD-10-CM | POA: Diagnosis not present

## 2017-02-27 DIAGNOSIS — E119 Type 2 diabetes mellitus without complications: Secondary | ICD-10-CM | POA: Diagnosis not present

## 2017-02-27 DIAGNOSIS — I10 Essential (primary) hypertension: Secondary | ICD-10-CM | POA: Diagnosis not present

## 2017-02-27 DIAGNOSIS — E784 Other hyperlipidemia: Secondary | ICD-10-CM | POA: Diagnosis not present

## 2017-03-07 DIAGNOSIS — I10 Essential (primary) hypertension: Secondary | ICD-10-CM | POA: Diagnosis not present

## 2017-03-07 DIAGNOSIS — E784 Other hyperlipidemia: Secondary | ICD-10-CM | POA: Diagnosis not present

## 2017-03-07 DIAGNOSIS — E119 Type 2 diabetes mellitus without complications: Secondary | ICD-10-CM | POA: Diagnosis not present

## 2017-03-07 DIAGNOSIS — Z0000 Encounter for general adult medical examination without abnormal findings: Secondary | ICD-10-CM | POA: Diagnosis not present

## 2017-03-07 DIAGNOSIS — Z1389 Encounter for screening for other disorder: Secondary | ICD-10-CM | POA: Diagnosis not present

## 2017-03-21 DIAGNOSIS — Z01419 Encounter for gynecological examination (general) (routine) without abnormal findings: Secondary | ICD-10-CM | POA: Diagnosis not present

## 2017-03-21 DIAGNOSIS — Z6838 Body mass index (BMI) 38.0-38.9, adult: Secondary | ICD-10-CM | POA: Diagnosis not present

## 2017-03-21 DIAGNOSIS — Z1231 Encounter for screening mammogram for malignant neoplasm of breast: Secondary | ICD-10-CM | POA: Diagnosis not present

## 2017-05-16 DIAGNOSIS — Z23 Encounter for immunization: Secondary | ICD-10-CM | POA: Diagnosis not present

## 2017-06-05 DIAGNOSIS — E119 Type 2 diabetes mellitus without complications: Secondary | ICD-10-CM | POA: Diagnosis not present

## 2017-06-05 DIAGNOSIS — I10 Essential (primary) hypertension: Secondary | ICD-10-CM | POA: Diagnosis not present

## 2017-06-05 DIAGNOSIS — E7849 Other hyperlipidemia: Secondary | ICD-10-CM | POA: Diagnosis not present

## 2017-06-05 DIAGNOSIS — Z6836 Body mass index (BMI) 36.0-36.9, adult: Secondary | ICD-10-CM | POA: Diagnosis not present

## 2017-09-05 DIAGNOSIS — L728 Other follicular cysts of the skin and subcutaneous tissue: Secondary | ICD-10-CM | POA: Diagnosis not present

## 2017-09-05 DIAGNOSIS — E7849 Other hyperlipidemia: Secondary | ICD-10-CM | POA: Diagnosis not present

## 2017-09-05 DIAGNOSIS — E1165 Type 2 diabetes mellitus with hyperglycemia: Secondary | ICD-10-CM | POA: Diagnosis not present

## 2017-09-05 DIAGNOSIS — I10 Essential (primary) hypertension: Secondary | ICD-10-CM | POA: Diagnosis not present

## 2017-12-12 DIAGNOSIS — E1165 Type 2 diabetes mellitus with hyperglycemia: Secondary | ICD-10-CM | POA: Diagnosis not present

## 2017-12-12 DIAGNOSIS — Z6837 Body mass index (BMI) 37.0-37.9, adult: Secondary | ICD-10-CM | POA: Diagnosis not present

## 2017-12-12 DIAGNOSIS — E7849 Other hyperlipidemia: Secondary | ICD-10-CM | POA: Diagnosis not present

## 2017-12-12 DIAGNOSIS — I10 Essential (primary) hypertension: Secondary | ICD-10-CM | POA: Diagnosis not present

## 2017-12-14 DIAGNOSIS — E1165 Type 2 diabetes mellitus with hyperglycemia: Secondary | ICD-10-CM | POA: Diagnosis not present

## 2018-03-06 DIAGNOSIS — E1165 Type 2 diabetes mellitus with hyperglycemia: Secondary | ICD-10-CM | POA: Diagnosis not present

## 2018-03-06 DIAGNOSIS — R82998 Other abnormal findings in urine: Secondary | ICD-10-CM | POA: Diagnosis not present

## 2018-03-06 DIAGNOSIS — Z0000 Encounter for general adult medical examination without abnormal findings: Secondary | ICD-10-CM | POA: Diagnosis not present

## 2018-03-06 DIAGNOSIS — E7849 Other hyperlipidemia: Secondary | ICD-10-CM | POA: Diagnosis not present

## 2018-03-11 DIAGNOSIS — E1165 Type 2 diabetes mellitus with hyperglycemia: Secondary | ICD-10-CM | POA: Diagnosis not present

## 2018-03-11 DIAGNOSIS — Z0000 Encounter for general adult medical examination without abnormal findings: Secondary | ICD-10-CM | POA: Diagnosis not present

## 2018-03-11 DIAGNOSIS — I10 Essential (primary) hypertension: Secondary | ICD-10-CM | POA: Diagnosis not present

## 2018-03-11 DIAGNOSIS — E7849 Other hyperlipidemia: Secondary | ICD-10-CM | POA: Diagnosis not present

## 2018-03-11 DIAGNOSIS — Z1389 Encounter for screening for other disorder: Secondary | ICD-10-CM | POA: Diagnosis not present

## 2018-05-15 DIAGNOSIS — Z6837 Body mass index (BMI) 37.0-37.9, adult: Secondary | ICD-10-CM | POA: Diagnosis not present

## 2018-05-15 DIAGNOSIS — N959 Unspecified menopausal and perimenopausal disorder: Secondary | ICD-10-CM | POA: Diagnosis not present

## 2018-05-15 DIAGNOSIS — Z1231 Encounter for screening mammogram for malignant neoplasm of breast: Secondary | ICD-10-CM | POA: Diagnosis not present

## 2018-05-15 DIAGNOSIS — Z01419 Encounter for gynecological examination (general) (routine) without abnormal findings: Secondary | ICD-10-CM | POA: Diagnosis not present

## 2018-07-01 DIAGNOSIS — E1165 Type 2 diabetes mellitus with hyperglycemia: Secondary | ICD-10-CM | POA: Diagnosis not present

## 2018-07-01 DIAGNOSIS — E7849 Other hyperlipidemia: Secondary | ICD-10-CM | POA: Diagnosis not present

## 2018-08-16 DIAGNOSIS — E7849 Other hyperlipidemia: Secondary | ICD-10-CM | POA: Diagnosis not present

## 2018-08-16 DIAGNOSIS — Z6836 Body mass index (BMI) 36.0-36.9, adult: Secondary | ICD-10-CM | POA: Diagnosis not present

## 2018-08-16 DIAGNOSIS — I10 Essential (primary) hypertension: Secondary | ICD-10-CM | POA: Diagnosis not present

## 2018-08-16 DIAGNOSIS — E1165 Type 2 diabetes mellitus with hyperglycemia: Secondary | ICD-10-CM | POA: Diagnosis not present

## 2018-11-28 DIAGNOSIS — E1165 Type 2 diabetes mellitus with hyperglycemia: Secondary | ICD-10-CM | POA: Diagnosis not present

## 2018-11-28 DIAGNOSIS — I10 Essential (primary) hypertension: Secondary | ICD-10-CM | POA: Diagnosis not present

## 2019-04-02 DIAGNOSIS — Z0000 Encounter for general adult medical examination without abnormal findings: Secondary | ICD-10-CM | POA: Diagnosis not present

## 2019-04-02 DIAGNOSIS — Z23 Encounter for immunization: Secondary | ICD-10-CM | POA: Diagnosis not present

## 2019-04-02 DIAGNOSIS — E7849 Other hyperlipidemia: Secondary | ICD-10-CM | POA: Diagnosis not present

## 2019-04-02 DIAGNOSIS — E1165 Type 2 diabetes mellitus with hyperglycemia: Secondary | ICD-10-CM | POA: Diagnosis not present

## 2019-04-07 DIAGNOSIS — E1165 Type 2 diabetes mellitus with hyperglycemia: Secondary | ICD-10-CM | POA: Diagnosis not present

## 2019-04-07 DIAGNOSIS — Z0000 Encounter for general adult medical examination without abnormal findings: Secondary | ICD-10-CM | POA: Diagnosis not present

## 2019-04-07 DIAGNOSIS — I10 Essential (primary) hypertension: Secondary | ICD-10-CM | POA: Diagnosis not present

## 2019-04-07 DIAGNOSIS — E785 Hyperlipidemia, unspecified: Secondary | ICD-10-CM | POA: Diagnosis not present

## 2019-04-07 DIAGNOSIS — Z1331 Encounter for screening for depression: Secondary | ICD-10-CM | POA: Diagnosis not present

## 2019-05-28 DIAGNOSIS — Z6837 Body mass index (BMI) 37.0-37.9, adult: Secondary | ICD-10-CM | POA: Diagnosis not present

## 2019-05-28 DIAGNOSIS — Z1231 Encounter for screening mammogram for malignant neoplasm of breast: Secondary | ICD-10-CM | POA: Diagnosis not present

## 2019-05-28 DIAGNOSIS — Z01419 Encounter for gynecological examination (general) (routine) without abnormal findings: Secondary | ICD-10-CM | POA: Diagnosis not present

## 2019-07-23 DIAGNOSIS — I10 Essential (primary) hypertension: Secondary | ICD-10-CM | POA: Diagnosis not present

## 2019-07-23 DIAGNOSIS — E1165 Type 2 diabetes mellitus with hyperglycemia: Secondary | ICD-10-CM | POA: Diagnosis not present

## 2019-07-23 DIAGNOSIS — E785 Hyperlipidemia, unspecified: Secondary | ICD-10-CM | POA: Diagnosis not present

## 2019-12-01 DIAGNOSIS — I10 Essential (primary) hypertension: Secondary | ICD-10-CM | POA: Diagnosis not present

## 2019-12-01 DIAGNOSIS — E7849 Other hyperlipidemia: Secondary | ICD-10-CM | POA: Diagnosis not present

## 2019-12-01 DIAGNOSIS — E1165 Type 2 diabetes mellitus with hyperglycemia: Secondary | ICD-10-CM | POA: Diagnosis not present

## 2020-04-23 DIAGNOSIS — Z0000 Encounter for general adult medical examination without abnormal findings: Secondary | ICD-10-CM | POA: Diagnosis not present

## 2020-04-23 DIAGNOSIS — E785 Hyperlipidemia, unspecified: Secondary | ICD-10-CM | POA: Diagnosis not present

## 2020-04-23 DIAGNOSIS — E1165 Type 2 diabetes mellitus with hyperglycemia: Secondary | ICD-10-CM | POA: Diagnosis not present

## 2020-04-28 DIAGNOSIS — Z0000 Encounter for general adult medical examination without abnormal findings: Secondary | ICD-10-CM | POA: Diagnosis not present

## 2020-04-28 DIAGNOSIS — Z23 Encounter for immunization: Secondary | ICD-10-CM | POA: Diagnosis not present

## 2020-04-28 DIAGNOSIS — Z20828 Contact with and (suspected) exposure to other viral communicable diseases: Secondary | ICD-10-CM | POA: Diagnosis not present

## 2020-04-28 DIAGNOSIS — E1165 Type 2 diabetes mellitus with hyperglycemia: Secondary | ICD-10-CM | POA: Diagnosis not present

## 2020-06-09 DIAGNOSIS — Z1211 Encounter for screening for malignant neoplasm of colon: Secondary | ICD-10-CM | POA: Diagnosis not present

## 2020-06-09 DIAGNOSIS — Z1212 Encounter for screening for malignant neoplasm of rectum: Secondary | ICD-10-CM | POA: Diagnosis not present

## 2020-09-10 DIAGNOSIS — I10 Essential (primary) hypertension: Secondary | ICD-10-CM | POA: Diagnosis not present

## 2020-09-10 DIAGNOSIS — E1169 Type 2 diabetes mellitus with other specified complication: Secondary | ICD-10-CM | POA: Diagnosis not present

## 2020-09-10 DIAGNOSIS — E785 Hyperlipidemia, unspecified: Secondary | ICD-10-CM | POA: Diagnosis not present

## 2020-12-13 DIAGNOSIS — E1169 Type 2 diabetes mellitus with other specified complication: Secondary | ICD-10-CM | POA: Diagnosis not present

## 2021-02-03 DIAGNOSIS — N762 Acute vulvitis: Secondary | ICD-10-CM | POA: Diagnosis not present

## 2021-02-03 DIAGNOSIS — Z13820 Encounter for screening for osteoporosis: Secondary | ICD-10-CM | POA: Diagnosis not present

## 2021-02-03 DIAGNOSIS — N952 Postmenopausal atrophic vaginitis: Secondary | ICD-10-CM | POA: Diagnosis not present

## 2021-02-03 DIAGNOSIS — Z6836 Body mass index (BMI) 36.0-36.9, adult: Secondary | ICD-10-CM | POA: Diagnosis not present

## 2021-02-03 DIAGNOSIS — Z01419 Encounter for gynecological examination (general) (routine) without abnormal findings: Secondary | ICD-10-CM | POA: Diagnosis not present

## 2021-02-03 DIAGNOSIS — Z1231 Encounter for screening mammogram for malignant neoplasm of breast: Secondary | ICD-10-CM | POA: Diagnosis not present

## 2021-04-27 DIAGNOSIS — E785 Hyperlipidemia, unspecified: Secondary | ICD-10-CM | POA: Diagnosis not present

## 2021-04-27 DIAGNOSIS — E1165 Type 2 diabetes mellitus with hyperglycemia: Secondary | ICD-10-CM | POA: Diagnosis not present

## 2021-05-11 DIAGNOSIS — Z23 Encounter for immunization: Secondary | ICD-10-CM | POA: Diagnosis not present

## 2021-05-11 DIAGNOSIS — I10 Essential (primary) hypertension: Secondary | ICD-10-CM | POA: Diagnosis not present

## 2021-05-11 DIAGNOSIS — Z0000 Encounter for general adult medical examination without abnormal findings: Secondary | ICD-10-CM | POA: Diagnosis not present

## 2021-05-11 DIAGNOSIS — Z1331 Encounter for screening for depression: Secondary | ICD-10-CM | POA: Diagnosis not present

## 2021-05-11 DIAGNOSIS — Z1339 Encounter for screening examination for other mental health and behavioral disorders: Secondary | ICD-10-CM | POA: Diagnosis not present

## 2021-08-23 DIAGNOSIS — I10 Essential (primary) hypertension: Secondary | ICD-10-CM | POA: Diagnosis not present

## 2021-08-23 DIAGNOSIS — E1169 Type 2 diabetes mellitus with other specified complication: Secondary | ICD-10-CM | POA: Diagnosis not present

## 2021-12-22 DIAGNOSIS — Z0289 Encounter for other administrative examinations: Secondary | ICD-10-CM

## 2022-01-04 LAB — TSH: TSH: 1.72 (ref 0.41–5.90)

## 2022-01-04 LAB — BASIC METABOLIC PANEL
BUN: 17 (ref 4–21)
CO2: 25 — AB (ref 13–22)
Chloride: 108 (ref 99–108)
Creatinine: 0.8 (ref 0.5–1.1)
Glucose: 177
Potassium: 4.8 mEq/L (ref 3.5–5.1)
Sodium: 142 (ref 137–147)

## 2022-01-04 LAB — LIPID PANEL
Cholesterol: 129 (ref 0–200)
HDL: 30 — AB (ref 35–70)
LDL Cholesterol: 73
Triglycerides: 129 (ref 40–160)

## 2022-01-04 LAB — HEPATIC FUNCTION PANEL
ALT: 21 U/L (ref 7–35)
AST: 15 (ref 13–35)
Alkaline Phosphatase: 86 (ref 25–125)
Bilirubin, Total: 0.9

## 2022-01-04 LAB — CBC AND DIFFERENTIAL
HCT: 44 (ref 36–46)
Hemoglobin: 14 (ref 12.0–16.0)
Platelets: 255 10*3/uL (ref 150–400)
WBC: 7

## 2022-01-04 LAB — COMPREHENSIVE METABOLIC PANEL
Albumin: 4.1 (ref 3.5–5.0)
Calcium: 9 (ref 8.7–10.7)

## 2022-01-04 LAB — CBC: RBC: 5 (ref 3.87–5.11)

## 2022-01-04 LAB — HEMOGLOBIN A1C: Hemoglobin A1C: 6.8

## 2022-02-21 ENCOUNTER — Ambulatory Visit (INDEPENDENT_AMBULATORY_CARE_PROVIDER_SITE_OTHER): Payer: BC Managed Care – PPO | Admitting: Family Medicine

## 2022-02-21 ENCOUNTER — Encounter (INDEPENDENT_AMBULATORY_CARE_PROVIDER_SITE_OTHER): Payer: Self-pay | Admitting: Family Medicine

## 2022-02-21 VITALS — BP 152/79 | HR 79 | Temp 97.6°F | Ht 67.0 in | Wt 221.0 lb

## 2022-02-21 DIAGNOSIS — E66811 Obesity, class 1: Secondary | ICD-10-CM

## 2022-02-21 DIAGNOSIS — R5383 Other fatigue: Secondary | ICD-10-CM | POA: Diagnosis not present

## 2022-02-21 DIAGNOSIS — E1159 Type 2 diabetes mellitus with other circulatory complications: Secondary | ICD-10-CM

## 2022-02-21 DIAGNOSIS — F17290 Nicotine dependence, other tobacco product, uncomplicated: Secondary | ICD-10-CM

## 2022-02-21 DIAGNOSIS — R0602 Shortness of breath: Secondary | ICD-10-CM | POA: Diagnosis not present

## 2022-02-21 DIAGNOSIS — E785 Hyperlipidemia, unspecified: Secondary | ICD-10-CM | POA: Diagnosis not present

## 2022-02-21 DIAGNOSIS — I152 Hypertension secondary to endocrine disorders: Secondary | ICD-10-CM | POA: Diagnosis not present

## 2022-02-21 DIAGNOSIS — E1165 Type 2 diabetes mellitus with hyperglycemia: Secondary | ICD-10-CM | POA: Diagnosis not present

## 2022-02-21 DIAGNOSIS — Z6834 Body mass index (BMI) 34.0-34.9, adult: Secondary | ICD-10-CM

## 2022-02-21 DIAGNOSIS — Z1331 Encounter for screening for depression: Secondary | ICD-10-CM | POA: Diagnosis not present

## 2022-02-21 DIAGNOSIS — E1169 Type 2 diabetes mellitus with other specified complication: Secondary | ICD-10-CM | POA: Diagnosis not present

## 2022-02-21 DIAGNOSIS — E669 Obesity, unspecified: Secondary | ICD-10-CM

## 2022-02-21 DIAGNOSIS — F17299 Nicotine dependence, other tobacco product, with unspecified nicotine-induced disorders: Secondary | ICD-10-CM

## 2022-02-21 DIAGNOSIS — Z7984 Long term (current) use of oral hypoglycemic drugs: Secondary | ICD-10-CM

## 2022-02-21 DIAGNOSIS — E559 Vitamin D deficiency, unspecified: Secondary | ICD-10-CM

## 2022-02-22 LAB — COMPREHENSIVE METABOLIC PANEL
ALT: 15 IU/L (ref 0–32)
AST: 16 IU/L (ref 0–40)
Albumin/Globulin Ratio: 1.7 (ref 1.2–2.2)
Albumin: 4.7 g/dL (ref 3.8–4.9)
Alkaline Phosphatase: 85 IU/L (ref 44–121)
BUN/Creatinine Ratio: 18 (ref 9–23)
BUN: 14 mg/dL (ref 6–24)
Bilirubin Total: 0.4 mg/dL (ref 0.0–1.2)
CO2: 22 mmol/L (ref 20–29)
Calcium: 9.6 mg/dL (ref 8.7–10.2)
Chloride: 101 mmol/L (ref 96–106)
Creatinine, Ser: 0.78 mg/dL (ref 0.57–1.00)
Globulin, Total: 2.7 g/dL (ref 1.5–4.5)
Glucose: 134 mg/dL — ABNORMAL HIGH (ref 70–99)
Potassium: 4.6 mmol/L (ref 3.5–5.2)
Sodium: 141 mmol/L (ref 134–144)
Total Protein: 7.4 g/dL (ref 6.0–8.5)
eGFR: 91 mL/min/{1.73_m2} (ref >59)

## 2022-02-22 LAB — TSH: TSH: 1.55 u[IU]/mL (ref 0.450–4.500)

## 2022-02-22 LAB — CBC WITH DIFFERENTIAL/PLATELET
Basophils Absolute: 0.1 10*3/uL (ref 0.0–0.2)
Basos: 1 %
EOS (ABSOLUTE): 0.2 10*3/uL (ref 0.0–0.4)
Eos: 2 %
Hematocrit: 43.9 % (ref 34.0–46.6)
Hemoglobin: 14.4 g/dL (ref 11.1–15.9)
Immature Grans (Abs): 0 10*3/uL (ref 0.0–0.1)
Immature Granulocytes: 0 %
Lymphocytes Absolute: 2.1 10*3/uL (ref 0.7–3.1)
Lymphs: 30 %
MCH: 27.7 pg (ref 26.6–33.0)
MCHC: 32.8 g/dL (ref 31.5–35.7)
MCV: 84 fL (ref 79–97)
Monocytes Absolute: 0.5 10*3/uL (ref 0.1–0.9)
Monocytes: 7 %
Neutrophils Absolute: 4.2 10*3/uL (ref 1.4–7.0)
Neutrophils: 60 %
Platelets: 278 10*3/uL (ref 150–450)
RBC: 5.2 x10E6/uL (ref 3.77–5.28)
RDW: 13.7 % (ref 11.7–15.4)
WBC: 6.9 10*3/uL (ref 3.4–10.8)

## 2022-02-22 LAB — VITAMIN B12: Vitamin B-12: 274 pg/mL (ref 232–1245)

## 2022-02-22 LAB — FOLATE: Folate: 5.7 ng/mL (ref >3.0)

## 2022-02-22 LAB — VITAMIN D 25 HYDROXY (VIT D DEFICIENCY, FRACTURES): Vit D, 25-Hydroxy: 16.1 ng/mL — ABNORMAL LOW (ref 30.0–100.0)

## 2022-02-22 LAB — INSULIN, RANDOM: INSULIN: 29.2 u[IU]/mL — ABNORMAL HIGH (ref 2.6–24.9)

## 2022-02-22 LAB — T4, FREE: Free T4: 1.35 ng/dL (ref 0.82–1.77)

## 2022-02-22 LAB — T3: T3, Total: 122 ng/dL (ref 71–180)

## 2022-02-22 NOTE — Progress Notes (Signed)
Chief Complaint:   OBESITY Marisa Andrews (MR# [id number]) is a 51 y.o. female who presents for evaluation and treatment of obesity and related comorbidities. Current BMI is Body mass index is 34.61 kg/m. Marisa Andrews has been struggling with her weight for many years and has been unsuccessful in either losing weight, maintaining weight loss, or reaching her healthy weight goal.  Marisa Andrews's friend comes to the clinic.  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  Eats out 4 times per week.  Skips breakfast 3 times per week.  Breakfast in the morning granola bar and large sugar-free coffee.  Lunch ham sandwich 2 slices of ham with 1 teaspoon of mayo and water. (Satisfied).  Dinner, 10 wings, [place] fries 1 cup (feels full).   Marisa Andrews is currently in the action stage of change and ready to dedicate time achieving and maintaining a healthier weight. Marisa Andrews is interested in becoming our patient and working on intensive lifestyle modifications including (but not limited to) diet and exercise for weight loss.  Marisa Andrews's habits were reviewed today and are as follows: Her family eats meals together, she thinks her family will eat healthier with her, her desired weight loss is 41 lbs, she has been heavy most of her life, her heaviest weight ever was 250 pounds, she has significant food cravings issues, she snacks frequently in the evenings, she skips meals frequently, she is frequently drinking liquids with calories, she frequently makes poor food choices, and she frequently eats larger portions than normal.  Depression Screen Marisa Andrews's Food and Mood (modified PHQ-9) score was 6.     02/21/2022    8:17 AM  Depression screen PHQ 2/9  Decreased Interest 1  Down, Depressed, Hopeless 0  PHQ - 2 Score 1  Altered sleeping 0  Tired, decreased energy 1  Change in appetite 1  Feeling bad or failure about yourself  1  Trouble concentrating 2  Moving slowly or fidgety/restless 0  Suicidal thoughts 0   PHQ-9 Score 6  Difficult doing work/chores Not difficult at all   Subjective:   1. Other fatigue Marisa Andrews admits to daytime somnolence and denies waking up still tired. Patient has a history of symptoms of daytime fatigue. Marisa Andrews generally gets 8 hours of sleep per night, and states that she has generally restful sleep. Snoring is present. Apneic episodes are not present. Epworth Sleepiness Score is 2.  EKG-normal sinus rhythm.  2. SOBOE (shortness of breath on exertion) Marisa Andrews notes increasing shortness of breath with exercising and seems to be worsening over time with weight gain. She notes getting out of breath sooner with activity than she used to. This has not gotten worse recently. Marisa Andrews denies shortness of breath at rest or orthopnea.  3. Type 2 diabetes mellitus with hyperglycemia, without long-term current use of insulin (HCC) Marisa Andrews's A1c on 01/03/2022 was 6.9.  Last eye exam was in June 2023.  She was diagnosed 13 years ago.  She is on metformin, Jardiance, and Januvia.  4. Hyperlipidemia associated with type 2 diabetes mellitus (HCC) Marisa Andrews is on rosuvastatin.  Labs were done in May 2023.  5. Hypertension associated with diabetes (HCC) Marisa Andrews's blood pressure is elevated today.  She denies chest pain, chest pressure, or headache.  EKG-normal sinus rhythm at 84 bpm.  6. 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  She recognizes there is a desire to quit just not right now.  7.  Vitamin D deficiency Marisa Andrews's diagnosis is likely given obesity and lack of supplementation.  She notes fatigue.  Assessment/Plan:   1. Other fatigue Marisa Andrews does feel that her weight is causing her energy to be lower than it should be. Fatigue may be related to obesity, depression or many other causes. Labs will be ordered, and in the meanwhile, Marisa Andrews will focus on self care including making healthy food choices, increasing physical activity and focusing on  stress reduction.  - EKG 12-Lead - Vitamin B12 - CBC with Differential/Platelet - Folate - TSH - T4, free - T3  2. SOBOE (shortness of breath on exertion) Marisa Andrews does feel that she gets out of breath more easily that she used to when she exercises. Marisa Andrews's shortness of breath appears to be obesity related and exercise induced. She has agreed to work on weight loss and gradually increase exercise to treat her exercise induced shortness of breath. Will continue to monitor closely.  3. Type 2 diabetes mellitus with hyperglycemia, without long-term current use of insulin (HCC) We will check labs today, and we will follow-up at Marisa Andrews's next appointment.  - Insulin, random  4. Hyperlipidemia associated with type 2 diabetes mellitus (HCC) Marisa Andrews is to get labs and bring her labs into her next appointment.  5. Hypertension associated with diabetes (HCC) EKG was done today, and we will check labs today.  We will follow-up at [name] next appointment.  - Comprehensive metabolic panel  6. 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  7. Vitamin D deficiency We will check labs today, and we will follow-up at Marisa Andrews's next appointment.  - VITAMIN D 25 Hydroxy (Vit-D Deficiency, Fractures)  8. Depression screening Marisa Andrews had a positive depression screening. Depression is commonly associated with obesity and often results in emotional eating behaviors. We will monitor this closely and work on CBT to help improve the non-hunger eating patterns. Referral to Psychology may be required if no improvement is seen as she continues in our clinic.  9. Class 1 obesity with serious comorbidity and body mass index (BMI) of 34.0 to 34.9 in adult, unspecified obesity type Marisa Andrews is currently in the action stage of change and her goal is to continue with weight loss efforts. I recommend Marisa Andrews begin the structured treatment plan as  follows:  She has agreed to the Category 3 Plan.  Exercise goals: No exercise has been prescribed at this time.   Behavioral modification strategies: increasing lean protein intake, decreasing eating out, meal planning and cooking strategies, keeping healthy foods in the home, and planning for success.  She was informed of the importance of frequent follow-up visits to maximize her success with intensive lifestyle modifications for her multiple health conditions. She was informed we would discuss her lab results at her next visit unless there is a critical issue that needs to be addressed sooner. Marisa Andrews agreed to keep her next visit at the agreed upon time to discuss these results.  Objective:   Blood pressure (!) 152/79, pulse 79, temperature 97.6 F (36.4 C), height 5\' 7"  (1.702 m), weight 221 lb (100.2 kg), SpO2 98 %. Body mass index is 34.61 kg/m.  EKG: Normal sinus rhythm, rate 84 BPM.  Indirect Calorimeter completed today shows a VO2 of 273 and a REE of 1886.  Her calculated basal metabolic rate is thus her basal metabolic rate is better than expected.  General: Cooperative, alert, well developed, in no acute distress. HEENT: Conjunctivae and lids  unremarkable. Cardiovascular: Regular rhythm.  Lungs: Normal work of breathing. Neurologic: No focal deficits.   Lab Results  Component Value Date   CREATININE 0.78 02/21/2022   BUN 14 02/21/2022   NA 141 02/21/2022   K 4.6 02/21/2022   CL 101 02/21/2022   CO2 22 02/21/2022   Lab Results  Component Value Date   ALT 15 02/21/2022   AST 16 02/21/2022   ALKPHOS 85 02/21/2022   BILITOT 0.4 02/21/2022   Lab Results  Component Value Date   HGBA1C 6.4 11/16/2009   HGBA1C 6.5 11/30/2008   Lab Results  Component Value Date   INSULIN 29.2 (H) 02/21/2022   Lab Results  Component Value Date   TSH 1.550 02/21/2022   Lab Results  Component Value Date   CHOL 176 11/16/2009   HDL 32.10 (L) 11/16/2009   LDLCALC 127 (H)  11/16/2009   TRIG 85.0 11/16/2009   CHOLHDL 5 11/16/2009   Lab Results  Component Value Date   WBC 6.9 02/21/2022   HGB 14.4 02/21/2022   HCT 43.9 02/21/2022   MCV 84 02/21/2022   PLT 278 02/21/2022   No results found for: "IRON", "TIBC", "FERRITIN"  Attestation Statements:   Reviewed by clinician on day of visit: allergies, medications, problem list, medical history, surgical history, family history, social history, and previous encounter notes.   I, Burt Knack, am acting as transcriptionist for Reuben Likes, MD. This is the patient's first visit at [facility]. The patient's NEW PATIENT PACKET was reviewed at length. Included in the packet: current and past health history, medications, allergies, ROS, gynecologic history (women only), surgical history, family history, social history, weight history, weight loss surgery history (for those that have had weight loss surgery), nutritional evaluation, mood and food questionnaire, PHQ9, Epworth questionnaire, sleep habits questionnaire, patient life and health improvement goals questionnaire. These will all be scanned into the patient's chart under media.   During the visit, I independently reviewed the patient's EKG, bioimpedance scale results, and indirect calorimeter results. I used this information to tailor a meal plan for the patient that will help her to lose weight and will improve her obesity-related conditions going forward. I performed a medically necessary appropriate examination and/or evaluation. I discussed the assessment and treatment plan with the patient. The patient was provided an opportunity to ask questions and all were answered. The patient agreed with the plan and demonstrated an understanding of the instructions. Labs were ordered at this visit and will be reviewed at the next visit unless more critical results need to be addressed immediately. Clinical information was updated and documented in the  EMR.   Time spent on visit including pre-visit chart review and post-visit care was 45 minutes.    I have reviewed the above documentation for accuracy and completeness, and I agree with the above. - Reuben Likes, MD

## 2022-03-01 ENCOUNTER — Ambulatory Visit (INDEPENDENT_AMBULATORY_CARE_PROVIDER_SITE_OTHER): Payer: Self-pay | Admitting: Bariatrics

## 2022-03-07 ENCOUNTER — Encounter (INDEPENDENT_AMBULATORY_CARE_PROVIDER_SITE_OTHER): Payer: Self-pay | Admitting: Family Medicine

## 2022-03-07 ENCOUNTER — Ambulatory Visit (INDEPENDENT_AMBULATORY_CARE_PROVIDER_SITE_OTHER): Payer: BC Managed Care – PPO | Admitting: Family Medicine

## 2022-03-07 VITALS — BP 130/82 | HR 93 | Temp 97.6°F | Ht 67.0 in | Wt 216.0 lb

## 2022-03-07 DIAGNOSIS — Z7984 Long term (current) use of oral hypoglycemic drugs: Secondary | ICD-10-CM

## 2022-03-07 DIAGNOSIS — E785 Hyperlipidemia, unspecified: Secondary | ICD-10-CM

## 2022-03-07 DIAGNOSIS — E559 Vitamin D deficiency, unspecified: Secondary | ICD-10-CM | POA: Diagnosis not present

## 2022-03-07 DIAGNOSIS — E1165 Type 2 diabetes mellitus with hyperglycemia: Secondary | ICD-10-CM

## 2022-03-07 DIAGNOSIS — E1169 Type 2 diabetes mellitus with other specified complication: Secondary | ICD-10-CM

## 2022-03-07 DIAGNOSIS — Z6833 Body mass index (BMI) 33.0-33.9, adult: Secondary | ICD-10-CM

## 2022-03-07 DIAGNOSIS — Z6834 Body mass index (BMI) 34.0-34.9, adult: Secondary | ICD-10-CM

## 2022-03-07 DIAGNOSIS — E669 Obesity, unspecified: Secondary | ICD-10-CM

## 2022-03-07 MED ORDER — OZEMPIC (0.25 OR 0.5 MG/DOSE) 2 MG/3ML ~~LOC~~ SOPN: 0.2500 mg | PEN_INJECTOR | SUBCUTANEOUS | 0 refills | Status: DC

## 2022-03-07 MED ORDER — VITAMIN D (ERGOCALCIFEROL) 1.25 MG (50000 UNIT) PO CAPS: 50000.0000 [IU] | ORAL_CAPSULE | ORAL | 0 refills | Status: DC

## 2022-03-09 NOTE — Progress Notes (Signed)
Chief Complaint:   OBESITY Marisa Andrews is here to discuss her progress with her obesity treatment plan along with follow-up of her obesity related diagnoses. Marisa Andrews is on the Category 3 Plan and states she is following her eating plan approximately 70% of the time. Marisa Andrews states she is exercising 0 minutes 0 times per week.  Today's visit was #: 2 Starting weight: 221 lbs Starting date: 02/21/2022 Today's weight: 216 lbs Today's date: 03/07/2022 Total lbs lost to date: 5 lbs Total lbs lost since last in-office visit: 5  Interim History: Marisa Andrews felt it was a significant amount of food--she could not get all food in. By the time she got to supper she was not really hungry and 1 day did not eat anything for breakfast. Snack calories--Special K bars, 100 cal pack of nuts and yogurt.  Subjective:   1. Hyperlipidemia associated with type 2 diabetes mellitus (HCC) Marisa Andrews is currently taking Crestor. Her last LDL in 2022 shows LDL 73, Hdl 30, and Trigly 129 with no myalgias or transaminitis.  2. Vitamin D deficiency Marisa Andrews's Vit D level of 16.1. She is not taking Vit D.  3. Type 2 diabetes mellitus with hyperglycemia, without long-term current use of insulin (HCC) Marisa Andrews's A1c was 6.9, she is on Metformin, Jardiance and Januvia. Increase in A1c even on medications.  Assessment/Plan:   1. Hyperlipidemia associated with type 2 diabetes mellitus (HCC) Will repeat labs in 2 months at next PCP appointment.  2. Vitamin D deficiency We will refill Vit D 50,000 IU once a week for 1 month with 0 refills.  -Refill Vitamin D, Ergocalciferol, (DRISDOL) 1.25 MG (50000 UNIT) CAPS capsule; Take 1 capsule (50,000 Units total) by mouth every 7 (seven) days.  Dispense: 4 capsule; Refill: 0  3. Type 2 diabetes mellitus with hyperglycemia, without long-term current use of insulin (HCC) START Ozempic 0.25 mg SubQ once weekly for 1 month with 0 refills. Stop Jardiance and Januvia.  -Start Semaglutide,0.25 or  0.5MG /DOS, (OZEMPIC, 0.25 OR 0.5 MG/DOSE,) 2 MG/3ML SOPN; Inject 0.25 mg into the skin once a week.  Dispense: 3 mL; Refill: 0  4. Obesity with current BMI of 33.8 Marisa Andrews is currently in the action stage of change. As such, her goal is to continue with weight loss efforts. She has agreed to the Category 3 Plan with protein shake for 2oz of meat.  Exercise goals: No exercise has been prescribed at this time.  Behavioral modification strategies: increasing lean protein intake, meal planning and cooking strategies, and keeping healthy foods in the home.  Marisa Andrews has agreed to follow-up with our clinic in 2 weeks. She was informed of the importance of frequent follow-up visits to maximize her success with intensive lifestyle modifications for her multiple health conditions.   Objective:   Blood pressure 130/82, pulse 93, temperature 97.6 F (36.4 C), height 5\' 7"  (1.702 m), weight 216 lb (98 kg), SpO2 96 %. Body mass index is 33.83 kg/m.  General: Cooperative, alert, well developed, in no acute distress. HEENT: Conjunctivae and lids unremarkable. Cardiovascular: Regular rhythm.  Lungs: Normal work of breathing. Neurologic: No focal deficits.   Lab Results  Component Value Date   CREATININE 0.78 02/21/2022   BUN 14 02/21/2022   NA 141 02/21/2022   K 4.6 02/21/2022   CL 101 02/21/2022   CO2 22 02/21/2022   Lab Results  Component Value Date   ALT 15 02/21/2022   AST 16 02/21/2022   ALKPHOS 85 02/21/2022   BILITOT 0.4  02/21/2022   Lab Results  Component Value Date   HGBA1C 6.8 01/04/2022   HGBA1C 6.4 11/16/2009   HGBA1C 6.5 11/30/2008   Lab Results  Component Value Date   INSULIN 29.2 (H) 02/21/2022   Lab Results  Component Value Date   TSH 1.550 02/21/2022   Lab Results  Component Value Date   CHOL 129 01/04/2022   HDL 30 (A) 01/04/2022   LDLCALC 73 01/04/2022   TRIG 129 01/04/2022   CHOLHDL 5 11/16/2009   Lab Results  Component Value Date   VD25OH 16.1 (L)  02/21/2022   Lab Results  Component Value Date   WBC 6.9 02/21/2022   HGB 14.4 02/21/2022   HCT 43.9 02/21/2022   MCV 84 02/21/2022   PLT 278 02/21/2022   No results found for: "IRON", "TIBC", "FERRITIN"  Attestation Statements:   Reviewed by clinician on day of visit: allergies, medications, problem list, medical history, surgical history, family history, social history, and previous encounter notes.  Time spent on visit including pre-visit chart review and post-visit care and charting was 45 minutes.   I, Fortino Sic, RMA am acting as transcriptionist for Reuben Likes, MD.  I have reviewed the above documentation for accuracy and completeness, and I agree with the above. - Reuben Likes, MD

## 2022-03-15 ENCOUNTER — Ambulatory Visit (INDEPENDENT_AMBULATORY_CARE_PROVIDER_SITE_OTHER): Payer: Self-pay | Admitting: Bariatrics

## 2022-03-21 ENCOUNTER — Ambulatory Visit (INDEPENDENT_AMBULATORY_CARE_PROVIDER_SITE_OTHER): Payer: BC Managed Care – PPO | Admitting: Family Medicine

## 2022-03-21 ENCOUNTER — Encounter (INDEPENDENT_AMBULATORY_CARE_PROVIDER_SITE_OTHER): Payer: Self-pay | Admitting: Family Medicine

## 2022-03-21 VITALS — BP 121/75 | HR 70 | Temp 97.7°F | Ht 67.0 in | Wt 214.0 lb

## 2022-03-21 DIAGNOSIS — E785 Hyperlipidemia, unspecified: Secondary | ICD-10-CM | POA: Diagnosis not present

## 2022-03-21 DIAGNOSIS — Z7985 Long-term (current) use of injectable non-insulin antidiabetic drugs: Secondary | ICD-10-CM

## 2022-03-21 DIAGNOSIS — E1169 Type 2 diabetes mellitus with other specified complication: Secondary | ICD-10-CM | POA: Diagnosis not present

## 2022-03-21 DIAGNOSIS — E669 Obesity, unspecified: Secondary | ICD-10-CM | POA: Diagnosis not present

## 2022-03-21 DIAGNOSIS — Z6833 Body mass index (BMI) 33.0-33.9, adult: Secondary | ICD-10-CM

## 2022-03-22 ENCOUNTER — Encounter (INDEPENDENT_AMBULATORY_CARE_PROVIDER_SITE_OTHER): Payer: Self-pay

## 2022-03-28 NOTE — Progress Notes (Signed)
Chief Complaint:   OBESITY Marisa Andrews is here to discuss her progress with her obesity treatment plan along with follow-up of her obesity related diagnoses. [name] is on the Category 3 Plan with protein shake for 2 oz of meat and states she is following her eating plan approximately 80% of the time. [name] states she is doing 0 minutes 0 times per week.  Today's visit was #: 3 Starting weight: 221 lbs Starting date: 02/21/2022 Today's weight: 214 lbs Today's date: 03/21/2022 Total lbs lost to date: 7 Total lbs lost since last in-office visit: 2  Interim History: [name] continues to do well with weight loss.  She notes decreased hunger and she struggles to eat all of her food especially dinner.  Subjective:   1. Type 2 diabetes mellitus with other specified complication, unspecified whether long term insulin use (HCC) Marisa Andrews started Ozempic at 0.25 mg and she is doing well.  She notes decrease in polyphagia.  I discussed labs with the patient today.  2. Hyperlipidemia, unspecified hyperlipidemia type Marisa Andrews is on Crestor and is working on her diet.  She denies chest pain or myalgias.  I discussed labs with the patient today.  Assessment/Plan:   1. Type 2 diabetes mellitus with other specified complication, unspecified whether long term insulin use (HCC) Marisa Andrews will continue metformin and Ozempic.  2. Hyperlipidemia, unspecified hyperlipidemia type Cardiovascular risk and specific lipid/LDL goals reviewed.  We discussed several lifestyle modifications today and [name] will continue Crestor, and will continue with her diet, exercise and weight loss efforts. Orders and follow up as documented in patient record.   3. Obesity, Current BMI 33.5 Marisa Andrews is currently in the action stage of change. As such, her goal is to continue with weight loss efforts. She has agreed to the Category 3 Plan and keeping a food journal and adhering to recommended goals of 400-600 calories and 40+ grams of protein at  supper daily.   Behavioral modification strategies: increasing lean protein intake.  Marisa Andrews has agreed to follow-up with our clinic in 2 weeks. She was informed of the importance of frequent follow-up visits to maximize her success with intensive lifestyle modifications for her multiple health conditions.   Objective:   Blood pressure 121/75, pulse 70, temperature 97.7 F (36.5 C), height 5\' 7"  (1.702 m), weight 214 lb (97.1 kg), SpO2 96 %. Body mass index is 33.52 kg/m.  General: Cooperative, alert, well developed, in no acute distress. HEENT: Conjunctivae and lids unremarkable. Cardiovascular: Regular rhythm.  Lungs: Normal work of breathing. Neurologic: No focal deficits.   Lab Results  Component Value Date   CREATININE 0.78 02/21/2022   BUN 14 02/21/2022   NA 141 02/21/2022   K 4.6 02/21/2022   CL 101 02/21/2022   CO2 22 02/21/2022   Lab Results  Component Value Date   ALT 15 02/21/2022   AST 16 02/21/2022   ALKPHOS 85 02/21/2022   BILITOT 0.4 02/21/2022   Lab Results  Component Value Date   HGBA1C 6.8 01/04/2022   HGBA1C 6.4 11/16/2009   HGBA1C 6.5 11/30/2008   Lab Results  Component Value Date   INSULIN 29.2 (H) 02/21/2022   Lab Results  Component Value Date   TSH 1.550 02/21/2022   Lab Results  Component Value Date   CHOL 129 01/04/2022   HDL 30 (A) 01/04/2022   LDLCALC 73 01/04/2022   TRIG 129 01/04/2022   CHOLHDL 5 11/16/2009   Lab Results  Component Value Date   VD25OH 16.1 (  L) 02/21/2022   Lab Results  Component Value Date   WBC 6.9 02/21/2022   HGB 14.4 02/21/2022   HCT 43.9 02/21/2022   MCV 84 02/21/2022   PLT 278 02/21/2022   No results found for: "IRON", "TIBC", "FERRITIN"  Attestation Statements:   Reviewed by clinician on day of visit: allergies, medications, problem list, medical history, surgical history, family history, social history, and previous encounter notes.  Time spent on visit including pre-visit chart review and  post-visit care and charting was 30 minutes.   I, Burt Knack, am acting as transcriptionist for Quillian Quince, MD.  I have reviewed the above documentation for accuracy and completeness, and I agree with the above. -  Quillian Quince, MD

## 2022-03-31 ENCOUNTER — Other Ambulatory Visit (INDEPENDENT_AMBULATORY_CARE_PROVIDER_SITE_OTHER): Payer: Self-pay | Admitting: Family Medicine

## 2022-03-31 DIAGNOSIS — E559 Vitamin D deficiency, unspecified: Secondary | ICD-10-CM

## 2022-04-03 ENCOUNTER — Encounter (INDEPENDENT_AMBULATORY_CARE_PROVIDER_SITE_OTHER): Payer: Self-pay | Admitting: Family Medicine

## 2022-04-03 ENCOUNTER — Ambulatory Visit (INDEPENDENT_AMBULATORY_CARE_PROVIDER_SITE_OTHER): Payer: BC Managed Care – PPO | Admitting: Family Medicine

## 2022-04-03 VITALS — BP 115/74 | HR 96 | Temp 97.6°F | Ht 67.0 in | Wt 211.0 lb

## 2022-04-03 DIAGNOSIS — E559 Vitamin D deficiency, unspecified: Secondary | ICD-10-CM

## 2022-04-03 DIAGNOSIS — Z7985 Long-term (current) use of injectable non-insulin antidiabetic drugs: Secondary | ICD-10-CM

## 2022-04-03 DIAGNOSIS — E1165 Type 2 diabetes mellitus with hyperglycemia: Secondary | ICD-10-CM | POA: Diagnosis not present

## 2022-04-03 DIAGNOSIS — E669 Obesity, unspecified: Secondary | ICD-10-CM

## 2022-04-03 DIAGNOSIS — Z6833 Body mass index (BMI) 33.0-33.9, adult: Secondary | ICD-10-CM

## 2022-04-03 DIAGNOSIS — Z7984 Long term (current) use of oral hypoglycemic drugs: Secondary | ICD-10-CM

## 2022-04-03 MED ORDER — VITAMIN D (ERGOCALCIFEROL) 1.25 MG (50000 UNIT) PO CAPS: 50000.0000 [IU] | ORAL_CAPSULE | ORAL | 0 refills | Status: DC

## 2022-04-03 MED ORDER — OZEMPIC (0.25 OR 0.5 MG/DOSE) 2 MG/3ML ~~LOC~~ SOPN: 0.2500 mg | PEN_INJECTOR | SUBCUTANEOUS | 0 refills | Status: DC

## 2022-04-13 NOTE — Progress Notes (Signed)
Chief Complaint:   OBESITY Marisa Andrews is here to discuss her progress with her obesity treatment plan along with follow-up of her obesity related diagnoses. Marisa Andrews is on the Category 3 Plan and keeping a food journal and adhering to recommended goals of 400-600 calories and 40+ grams of  protein and states she is following her eating plan approximately 80% of the time. Marisa Andrews states she is walking at work 2-5 times per week.  Today's visit was #: 4 Starting weight: 221 lbs Starting date: 02/21/2022 Today's weight: 211 lbs Today's date: 04/03/2022 Total lbs lost to date: 10 lbs Total lbs lost since last in-office visit: 3  Interim History: Marisa Andrews has been mostly working over the last few weeks. Starting to get tired of food choices. Not hungry on Ozempic. Still trying to be creative with food choices. No plans coming up. 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  Subjective:   1. Vitamin D deficiency Marisa Andrews's last Vit D level 16.1. Denies any nausea, vomiting or muscle weakness. She notes fatigue.  2. Type 2 diabetes mellitus with hyperglycemia, without long-term current use of insulin (HCC) Marisa Andrews is on Metformin and Ozempic. Last A1c 6.8, insulin 29.2.  Assessment/Plan:   1. Vitamin D deficiency We will refill Vit D 50K IU once weekly for 1 month with 0 refills.  -Refill Vitamin D, Ergocalciferol, (DRISDOL) 1.25 MG (50000 UNIT) CAPS capsule; Take 1 capsule (50,000 Units total) by mouth every 7 (seven) days.  Dispense: 4 capsule; Refill: 0  2. Type 2 diabetes mellitus with hyperglycemia, without long-term current use of insulin (HCC) We will refill Ozempic 0.25 mg SubQ weekly for 1 month with 0 refills.  -Refill Semaglutide,0.25 or 0.5MG /DOS, (OZEMPIC, 0.25 OR 0.5 MG/DOSE,) 2 MG/3ML SOPN; Inject 0.25 mg into the skin once a week.  Dispense: 3 mL; Refill: 0  3. Obesity with current BMI of 33.0 Jazzmyn is currently in the action stage of change. As such, her goal is to continue with weight  loss efforts. She has agreed to the Category 3 Plan.   Marisa Andrews will continue working on quantity of food over next few weeks.  Exercise goals: All adults should avoid inactivity. Some physical activity is better than none, and adults who participate in any amount of physical activity gain some health benefits.  Behavioral modification strategies: increasing lean protein intake, meal planning and cooking strategies, and keeping healthy foods in the home.  Marisa Andrews has agreed to follow-up with our clinic in 3 weeks. She was informed of the importance of frequent follow-up visits to maximize her success with intensive lifestyle modifications for her multiple health conditions.   Objective:   Blood pressure 115/74, pulse 96, temperature 97.6 F (36.4 C), height 5\' 7"  (1.702 m), weight 211 lb (95.7 kg), SpO2 96 %. Body mass index is 33.05 kg/m.  General: Cooperative, alert, well developed, in no acute distress. HEENT: Conjunctivae and lids unremarkable. Cardiovascular: Regular rhythm.  Lungs: Normal work of breathing. Neurologic: No focal deficits.   Lab Results  Component Value Date   CREATININE 0.78 02/21/2022   BUN 14 02/21/2022   NA 141 02/21/2022   K 4.6 02/21/2022   CL 101 02/21/2022   CO2 22 02/21/2022   Lab Results  Component Value Date   ALT 15 02/21/2022   AST 16 02/21/2022   ALKPHOS 85 02/21/2022   BILITOT 0.4 02/21/2022   Lab Results  Component Value Date   HGBA1C 6.8 01/04/2022   HGBA1C 6.4 11/16/2009   HGBA1C  6.5 11/30/2008   Lab Results  Component Value Date   INSULIN 29.2 (H) 02/21/2022   Lab Results  Component Value Date   TSH 1.550 02/21/2022   Lab Results  Component Value Date   CHOL 129 01/04/2022   HDL 30 (A) 01/04/2022   LDLCALC 73 01/04/2022   TRIG 129 01/04/2022   CHOLHDL 5 11/16/2009   Lab Results  Component Value Date   VD25OH 16.1 (L) 02/21/2022   Lab Results  Component Value Date   WBC 6.9 02/21/2022   HGB 14.4 02/21/2022   HCT  43.9 02/21/2022   MCV 84 02/21/2022   PLT 278 02/21/2022   No results found for: "IRON", "TIBC", "FERRITIN"  Attestation Statements:   Reviewed by clinician on day of visit: allergies, medications, problem list, medical history, surgical history, family history, social history, and previous encounter notes.  I, Fortino Sic, RMA am acting as transcriptionist for Reuben Likes, MD. I have reviewed the above documentation for accuracy and completeness, and I agree with the above. - Reuben Likes, MD

## 2022-04-26 ENCOUNTER — Encounter (INDEPENDENT_AMBULATORY_CARE_PROVIDER_SITE_OTHER): Payer: Self-pay | Admitting: Family Medicine

## 2022-04-26 ENCOUNTER — Ambulatory Visit (INDEPENDENT_AMBULATORY_CARE_PROVIDER_SITE_OTHER): Payer: BC Managed Care – PPO | Admitting: Family Medicine

## 2022-04-26 VITALS — BP 148/76 | HR 77 | Temp 98.0°F | Ht 67.0 in | Wt 208.0 lb

## 2022-04-26 DIAGNOSIS — E1165 Type 2 diabetes mellitus with hyperglycemia: Secondary | ICD-10-CM | POA: Diagnosis not present

## 2022-04-26 DIAGNOSIS — Z7984 Long term (current) use of oral hypoglycemic drugs: Secondary | ICD-10-CM

## 2022-04-26 DIAGNOSIS — I152 Hypertension secondary to endocrine disorders: Secondary | ICD-10-CM

## 2022-04-26 DIAGNOSIS — E559 Vitamin D deficiency, unspecified: Secondary | ICD-10-CM

## 2022-04-26 DIAGNOSIS — E669 Obesity, unspecified: Secondary | ICD-10-CM

## 2022-04-26 DIAGNOSIS — E1159 Type 2 diabetes mellitus with other circulatory complications: Secondary | ICD-10-CM

## 2022-04-26 DIAGNOSIS — Z7985 Long-term (current) use of injectable non-insulin antidiabetic drugs: Secondary | ICD-10-CM

## 2022-04-26 DIAGNOSIS — Z6832 Body mass index (BMI) 32.0-32.9, adult: Secondary | ICD-10-CM

## 2022-04-26 MED ORDER — VITAMIN D (ERGOCALCIFEROL) 1.25 MG (50000 UNIT) PO CAPS: 50000.0000 [IU] | ORAL_CAPSULE | ORAL | 0 refills | Status: DC

## 2022-04-26 MED ORDER — OZEMPIC (0.25 OR 0.5 MG/DOSE) 2 MG/3ML ~~LOC~~ SOPN: 0.2500 mg | PEN_INJECTOR | SUBCUTANEOUS | 0 refills | Status: DC

## 2022-04-28 ENCOUNTER — Other Ambulatory Visit (INDEPENDENT_AMBULATORY_CARE_PROVIDER_SITE_OTHER): Payer: Self-pay | Admitting: Family Medicine

## 2022-04-28 DIAGNOSIS — E559 Vitamin D deficiency, unspecified: Secondary | ICD-10-CM

## 2022-04-28 DIAGNOSIS — E1165 Type 2 diabetes mellitus with hyperglycemia: Secondary | ICD-10-CM

## 2022-04-30 NOTE — Progress Notes (Unsigned)
Chief Complaint:   OBESITY Marisa Andrews is here to discuss her progress with her obesity treatment plan along with follow-up of her obesity related diagnoses. Marisa Andrews is on the Category 3 Plan and states she is following her eating plan approximately 90% of the time. Marisa Andrews states she is walking 3 miles 2 to 5 times per week.  Today's visit was #: 5 Starting weight: 221 lbs Starting date: 02/21/2022 Today's weight: 208 lbs Today's date: 04/26/2022 Total lbs lost to date: 13 Total lbs lost since last in-office visit: 3  Interim History: Marisa Andrews passed out a few days ago, which she is unsure of why. She felt clammy and nauseous, then passed out. Marisa Andrews does recognize that it had been 7 hours since she had eaten. Sometimes, she can't find the bread. She has been getting in 8 ounces of protein at night. Marisa Andrews is eating an extra apple, Special K, and Fiber 1 brownies.  Subjective:   1. Vitamin D deficiency Pearson's last vitamin D level was 16.1. She is currently taking prescription vitamin D 50,000 IU each week. She notes fatigue and denies nausea, vomiting, or muscle weakness.  2. Type 2 diabetes mellitus with hyperglycemia, without long-term current use of insulin (HCC) Marisa Andrews's A1c was 6.8 and she is on Ozempic and metformin.  3. Hypertension associated with diabetes (HCC) Marisa Andrews's blood pressure was slightly elevated today and she is not on medication. She denies chest pain, chest pressure, or headache.  Assessment/Plan:   1. Vitamin D deficiency Marisa Andrews agrees to continue taking prescription vitamin D 50,000IU weekly and will follow up as directed.  - Vitamin D, Ergocalciferol, (DRISDOL) 1.25 MG (50000 UNIT) CAPS capsule; Take 1 capsule (50,000 Units total) by mouth every 7 (seven) days.  Dispense: 4 capsule; Refill: 0  2. Type 2 diabetes mellitus with hyperglycemia, without long-term current use of insulin (HCC) Marisa Andrews agrees to continue taking Ozempic 0.25mg  weekly and will follow up as  agreed.  - Semaglutide,0.25 or 0.5MG /DOS, (OZEMPIC, 0.25 OR 0.5 MG/DOSE,) 2 MG/3ML SOPN; Inject 0.25 mg into the skin once a week.  Dispense: 3 mL; Refill: 0  3. Hypertension associated with diabetes (HCC) Marisa will follow up Marisa Andrews's blood pressure at her next appointment. Marisa may need to discuss medication initiation if her blood pressure stays elevated.   4. Obesity with current BMI of 32.7 Marisa Andrews is currently in the action stage of change. As such, her goal is to continue with weight loss efforts. She has agreed to the Category 3 Plan.   Exercise goals: All adults should avoid inactivity. Some physical activity is better than none, and adults who participate in any amount of physical activity gain some health benefits.  Behavioral modification strategies: increasing lean protein intake, meal planning and cooking strategies, keeping healthy foods in the home, and planning for success.  Marisa Andrews has agreed to follow-up with our clinic in 3 weeks. She was informed of the importance of frequent follow-up visits to maximize her success with intensive lifestyle modifications for her multiple health conditions.   Objective:   Blood pressure (!) 148/76, pulse 77, temperature 98 F (36.7 C), height 5\' 7"  (1.702 m), weight 208 lb (94.3 kg), SpO2 97 %. Body mass index is 32.58 kg/m.  General: Cooperative, alert, well developed, in no acute distress. HEENT: Conjunctivae and lids unremarkable. Cardiovascular: Regular rhythm.  Lungs: Normal work of breathing. Neurologic: No focal deficits.   Lab Results  Component Value Date   CREATININE 0.78 02/21/2022   BUN 14 02/21/2022  NA 141 02/21/2022   K 4.6 02/21/2022   CL 101 02/21/2022   CO2 22 02/21/2022   Lab Results  Component Value Date   ALT 15 02/21/2022   AST 16 02/21/2022   ALKPHOS 85 02/21/2022   BILITOT 0.4 02/21/2022   Lab Results  Component Value Date   HGBA1C 6.8 01/04/2022   HGBA1C 6.4 11/16/2009   HGBA1C 6.5 11/30/2008    Lab Results  Component Value Date   INSULIN 29.2 (H) 02/21/2022   Lab Results  Component Value Date   TSH 1.550 02/21/2022   Lab Results  Component Value Date   CHOL 129 01/04/2022   HDL 30 (A) 01/04/2022   LDLCALC 73 01/04/2022   TRIG 129 01/04/2022   CHOLHDL 5 11/16/2009   Lab Results  Component Value Date   VD25OH 16.1 (L) 02/21/2022   Lab Results  Component Value Date   WBC 6.9 02/21/2022   HGB 14.4 02/21/2022   HCT 43.9 02/21/2022   MCV 84 02/21/2022   PLT 278 02/21/2022   No results found for: "IRON", "TIBC", "FERRITIN"  Attestation Statements:   Reviewed by clinician on day of visit: allergies, medications, problem list, medical history, surgical history, family history, social history, and previous encounter notes.  IMarcille Blanco, CMA, am acting as transcriptionist for Coralie Common, MD  I have reviewed the above documentation for accuracy and completeness, and I agree with the above. - Coralie Common, MD

## 2022-05-24 ENCOUNTER — Encounter (INDEPENDENT_AMBULATORY_CARE_PROVIDER_SITE_OTHER): Payer: Self-pay | Admitting: Family Medicine

## 2022-05-24 ENCOUNTER — Ambulatory Visit (INDEPENDENT_AMBULATORY_CARE_PROVIDER_SITE_OTHER): Payer: BC Managed Care – PPO | Admitting: Family Medicine

## 2022-05-24 VITALS — BP 135/80 | HR 84 | Temp 97.5°F | Ht 67.0 in | Wt 208.0 lb

## 2022-05-24 DIAGNOSIS — E1165 Type 2 diabetes mellitus with hyperglycemia: Secondary | ICD-10-CM

## 2022-05-24 DIAGNOSIS — E559 Vitamin D deficiency, unspecified: Secondary | ICD-10-CM

## 2022-05-24 DIAGNOSIS — Z6834 Body mass index (BMI) 34.0-34.9, adult: Secondary | ICD-10-CM

## 2022-05-24 DIAGNOSIS — Z7985 Long-term (current) use of injectable non-insulin antidiabetic drugs: Secondary | ICD-10-CM

## 2022-05-24 DIAGNOSIS — Z7984 Long term (current) use of oral hypoglycemic drugs: Secondary | ICD-10-CM

## 2022-05-24 DIAGNOSIS — Z6832 Body mass index (BMI) 32.0-32.9, adult: Secondary | ICD-10-CM | POA: Diagnosis not present

## 2022-05-24 DIAGNOSIS — E669 Obesity, unspecified: Secondary | ICD-10-CM | POA: Diagnosis not present

## 2022-05-24 MED ORDER — OZEMPIC (0.25 OR 0.5 MG/DOSE) 2 MG/3ML ~~LOC~~ SOPN: 0.2500 mg | PEN_INJECTOR | SUBCUTANEOUS | 0 refills | Status: DC

## 2022-05-24 MED ORDER — VITAMIN D (ERGOCALCIFEROL) 1.25 MG (50000 UNIT) PO CAPS: 50000.0000 [IU] | ORAL_CAPSULE | ORAL | 0 refills | Status: DC

## 2022-05-25 ENCOUNTER — Other Ambulatory Visit (INDEPENDENT_AMBULATORY_CARE_PROVIDER_SITE_OTHER): Payer: Self-pay | Admitting: Family Medicine

## 2022-05-25 DIAGNOSIS — E559 Vitamin D deficiency, unspecified: Secondary | ICD-10-CM

## 2022-05-29 NOTE — Progress Notes (Signed)
Chief Complaint:   OBESITY Marisa Andrews is here to discuss her progress with her obesity treatment plan along with follow-up of her obesity related diagnoses. Marisa Andrews is on the Category 3 Plan and states she is following her eating plan approximately 80% of the time. Marisa Andrews states she is walking at work 3 miles 2-5 times per week.  Today's visit was #: 6 Starting weight: 221 lbs Starting date: 02/21/2022 Today's weight: 208 lbs Today's date: 05/24/2022 Total lbs lost to date: 13 lbs Total lbs lost since last in-office visit: 0  Interim History: Marisa Andrews has been focusing on remodeling and not sticking to meal plan as strictly as she would like. Did have to eat out more than she anticipated. No events or travel planned in next few weeks. Going to [employer] the 2nd week in November.  Subjective:   1. Type 2 diabetes mellitus with hyperglycemia, without long-term current use of insulin (HCC) Meekah's recent A1c of 6.0 on Ozempic and Metformin. Denies GI side effects.  2. Vitamin D deficiency Marisa Andrews is currently taking prescription Vit D 50,000 IU once a week. Her last Vit D level of 16.1.  Assessment/Plan:   1. Type 2 diabetes mellitus with hyperglycemia, without long-term current use of insulin (HCC) We will refill Ozempic 0.25 mg SubQ once weekly for 1 month with 0 refills.  -Refill Semaglutide,0.25 or 0.5MG /DOS, (OZEMPIC, 0.25 OR 0.5 MG/DOSE,) 2 MG/3ML SOPN; Inject 0.25 mg into the skin once a week.  Dispense: 3 mL; Refill: 0  2. Vitamin D deficiency We will refill Vit D 50,000 IU once a week for 1 month with 0 refills.  -Refill Vitamin D, Ergocalciferol, (DRISDOL) 1.25 MG (50000 UNIT) CAPS capsule; Take 1 capsule (50,000 Units total) by mouth every 7 (seven) days.  Dispense: 4 capsule; Refill: 0  3. Obesity with current BMI of 32.6 Marisa Andrews is currently in the action stage of change. As such, her goal is to continue with weight loss efforts. She has agreed to the Category 3 Plan.    Exercise goals: All adults should avoid inactivity. Some physical activity is better than none, and adults who participate in any amount of physical activity gain some health benefits. Consistent exercise planning.  Behavioral modification strategies: increasing lean protein intake, no skipping meals, meal planning and cooking strategies, and planning for success.  Marisa Andrews has agreed to follow-up with our clinic in 3 weeks. She was informed of the importance of frequent follow-up visits to maximize her success with intensive lifestyle modifications for her multiple health conditions.   Objective:   Blood pressure 135/80, pulse 84, temperature (!) 97.5 F (36.4 C), height 5\' 7"  (1.702 m), weight 208 lb (94.3 kg), SpO2 100 %. Body mass index is 32.58 kg/m.  General: Cooperative, alert, well developed, in no acute distress. HEENT: Conjunctivae and lids unremarkable. Cardiovascular: Regular rhythm.  Lungs: Normal work of breathing. Neurologic: No focal deficits.   Lab Results  Component Value Date   CREATININE 0.78 02/21/2022   BUN 14 02/21/2022   NA 141 02/21/2022   K 4.6 02/21/2022   CL 101 02/21/2022   CO2 22 02/21/2022   Lab Results  Component Value Date   ALT 15 02/21/2022   AST 16 02/21/2022   ALKPHOS 85 02/21/2022   BILITOT 0.4 02/21/2022   Lab Results  Component Value Date   HGBA1C 6.8 01/04/2022   HGBA1C 6.4 11/16/2009   HGBA1C 6.5 11/30/2008   Lab Results  Component Value Date   INSULIN 29.2 (  H) 02/21/2022   Lab Results  Component Value Date   TSH 1.550 02/21/2022   Lab Results  Component Value Date   CHOL 129 01/04/2022   HDL 30 (A) 01/04/2022   LDLCALC 73 01/04/2022   TRIG 129 01/04/2022   CHOLHDL 5 11/16/2009   Lab Results  Component Value Date   VD25OH 16.1 (L) 02/21/2022   Lab Results  Component Value Date   WBC 6.9 02/21/2022   HGB 14.4 02/21/2022   HCT 43.9 02/21/2022   MCV 84 02/21/2022   PLT 278 02/21/2022   No results found for:  "IRON", "TIBC", "FERRITIN"  Attestation Statements:   Reviewed by clinician on day of visit: allergies, medications, problem list, medical history, surgical history, family history, social history, and previous encounter notes.  I, Elnora Morrison, RMA am acting as transcriptionist for Coralie Common, MD.  I have reviewed the above documentation for accuracy and completeness, and I agree with the above. - Coralie Common, MD

## 2022-06-18 ENCOUNTER — Other Ambulatory Visit (INDEPENDENT_AMBULATORY_CARE_PROVIDER_SITE_OTHER): Payer: Self-pay | Admitting: Family Medicine

## 2022-06-18 DIAGNOSIS — E559 Vitamin D deficiency, unspecified: Secondary | ICD-10-CM

## 2022-07-05 ENCOUNTER — Encounter (INDEPENDENT_AMBULATORY_CARE_PROVIDER_SITE_OTHER): Payer: Self-pay | Admitting: Family Medicine

## 2022-07-05 ENCOUNTER — Ambulatory Visit (INDEPENDENT_AMBULATORY_CARE_PROVIDER_SITE_OTHER): Payer: BC Managed Care – PPO | Admitting: Family Medicine

## 2022-07-05 VITALS — BP 150/85 | HR 89 | Temp 97.6°F | Ht 67.0 in | Wt 209.0 lb

## 2022-07-05 DIAGNOSIS — Z7984 Long term (current) use of oral hypoglycemic drugs: Secondary | ICD-10-CM

## 2022-07-05 DIAGNOSIS — E559 Vitamin D deficiency, unspecified: Secondary | ICD-10-CM | POA: Diagnosis not present

## 2022-07-05 DIAGNOSIS — E669 Obesity, unspecified: Secondary | ICD-10-CM | POA: Diagnosis not present

## 2022-07-05 DIAGNOSIS — E1165 Type 2 diabetes mellitus with hyperglycemia: Secondary | ICD-10-CM

## 2022-07-05 DIAGNOSIS — Z6832 Body mass index (BMI) 32.0-32.9, adult: Secondary | ICD-10-CM

## 2022-07-05 DIAGNOSIS — Z7985 Long-term (current) use of injectable non-insulin antidiabetic drugs: Secondary | ICD-10-CM

## 2022-07-05 MED ORDER — VITAMIN D (ERGOCALCIFEROL) 1.25 MG (50000 UNIT) PO CAPS: 50000.0000 [IU] | ORAL_CAPSULE | ORAL | 0 refills | Status: DC

## 2022-07-16 ENCOUNTER — Other Ambulatory Visit (INDEPENDENT_AMBULATORY_CARE_PROVIDER_SITE_OTHER): Payer: Self-pay | Admitting: Family Medicine

## 2022-07-16 DIAGNOSIS — E1165 Type 2 diabetes mellitus with hyperglycemia: Secondary | ICD-10-CM

## 2022-07-19 NOTE — Progress Notes (Signed)
Chief Complaint:   OBESITY Marisa Andrews is here to discuss her progress with her obesity treatment plan along with follow-up of her obesity related diagnoses. Marisa Andrews is on the Category 3 Plan and states she is following her eating plan approximately 50% of the time. Marisa Andrews states she is walking 3-4 miles 3 times per week.  Today's visit was #: 7 Starting weight: 221 lbs Starting date: 02/21/2022 Today's weight: 209 lbs Today's date: 07/05/2022 Total lbs lost to date: 12 Total lbs lost since last in-office visit: +1  Interim History: Pt returned from [facility] on vacation. She has been eating indulgently more frequently than she wanted. Pt wants to get back on category 3. Most of the food is stocked in her house.  Subjective:   1. Vitamin D deficiency Marisa Andrews is out of prescription Vit D. She notes fatigue. Vitamin D level not at goal.  2. Type 2 diabetes mellitus with hyperglycemia, without long-term current use of insulin (HCC) Marisa Andrews's A1c is 6.8 with an insulin level of 29.2. She is on Metformin and Ozempic.  Assessment/Plan:   1. Vitamin D deficiency Low Vitamin D level contributes to fatigue and are associated with obesity, breast, and colon cancer. She agrees to continue to take prescription Vitamin D 50,000 IU every week and will follow-up for routine testing of Vitamin D, at least 2-3 times per year to avoid over-replacement.  Refill- Vitamin D, Ergocalciferol, (DRISDOL) 1.25 MG (50000 UNIT) CAPS capsule; Take 1 capsule (50,000 Units total) by mouth every 7 (seven) days.  Dispense: 4 capsule; Refill: 0  2. Type 2 diabetes mellitus with hyperglycemia, without long-term current use of insulin (HCC) Good blood sugar control is important to decrease the likelihood of diabetic complications such as nephropathy, neuropathy, limb loss, blindness, coronary artery disease, and death. Intensive lifestyle modification including diet, exercise and weight loss are the first line of treatment  for diabetes.  Continue current meds. No refill needed.  3. Obesity with current BMI of 32.9 Marisa Andrews is currently in the action stage of change. As such, her goal is to continue with weight loss efforts. She has agreed to the Category 3 Plan.   Exercise goals: All adults should avoid inactivity. Some physical activity is better than none, and adults who participate in any amount of physical activity gain some health benefits.  Behavioral modification strategies: increasing lean protein intake, meal planning and cooking strategies, keeping healthy foods in the home, and planning for success.  Marisa Andrews has agreed to follow-up with our clinic in 3 weeks. She was informed of the importance of frequent follow-up visits to maximize her success with intensive lifestyle modifications for her multiple health conditions.   Objective:   Blood pressure (!) 150/85, pulse 89, temperature 97.6 F (36.4 C), height 5\' 7"  (1.702 m), weight 209 lb (94.8 kg), SpO2 99 %. Body mass index is 32.73 kg/m.  General: Cooperative, alert, well developed, in no acute distress. HEENT: Conjunctivae and lids unremarkable. Cardiovascular: Regular rhythm.  Lungs: Normal work of breathing. Neurologic: No focal deficits.   Lab Results  Component Value Date   CREATININE 0.78 02/21/2022   BUN 14 02/21/2022   NA 141 02/21/2022   K 4.6 02/21/2022   CL 101 02/21/2022   CO2 22 02/21/2022   Lab Results  Component Value Date   ALT 15 02/21/2022   AST 16 02/21/2022   ALKPHOS 85 02/21/2022   BILITOT 0.4 02/21/2022   Lab Results  Component Value Date   HGBA1C 6.8  01/04/2022   HGBA1C 6.4 11/16/2009   HGBA1C 6.5 11/30/2008   Lab Results  Component Value Date   INSULIN 29.2 (H) 02/21/2022   Lab Results  Component Value Date   TSH 1.550 02/21/2022   Lab Results  Component Value Date   CHOL 129 01/04/2022   HDL 30 (A) 01/04/2022   LDLCALC 73 01/04/2022   TRIG 129 01/04/2022   CHOLHDL 5 11/16/2009   Lab Results   Component Value Date   VD25OH 16.1 (L) 02/21/2022   Lab Results  Component Value Date   WBC 6.9 02/21/2022   HGB 14.4 02/21/2022   HCT 43.9 02/21/2022   MCV 84 02/21/2022   PLT 278 02/21/2022    Attestation Statements:   Reviewed by clinician on day of visit: allergies, medications, problem list, medical history, surgical history, family history, social history, and previous encounter notes.  I, Kyung Rudd, BS, CMA, am acting as transcriptionist for Reuben Likes, MD.   I have reviewed the above documentation for accuracy and completeness, and I agree with the above. - Reuben Likes, MD

## 2022-07-29 ENCOUNTER — Other Ambulatory Visit (INDEPENDENT_AMBULATORY_CARE_PROVIDER_SITE_OTHER): Payer: Self-pay | Admitting: Family Medicine

## 2022-07-29 DIAGNOSIS — E559 Vitamin D deficiency, unspecified: Secondary | ICD-10-CM

## 2022-08-02 ENCOUNTER — Ambulatory Visit (INDEPENDENT_AMBULATORY_CARE_PROVIDER_SITE_OTHER): Payer: BC Managed Care – PPO | Admitting: Family Medicine

## 2022-08-02 ENCOUNTER — Encounter (INDEPENDENT_AMBULATORY_CARE_PROVIDER_SITE_OTHER): Payer: Self-pay | Admitting: Family Medicine

## 2022-08-02 VITALS — BP 145/82 | HR 78 | Temp 98.0°F | Ht 67.0 in | Wt 210.0 lb

## 2022-08-02 DIAGNOSIS — E1165 Type 2 diabetes mellitus with hyperglycemia: Secondary | ICD-10-CM

## 2022-08-02 DIAGNOSIS — E559 Vitamin D deficiency, unspecified: Secondary | ICD-10-CM

## 2022-08-02 DIAGNOSIS — E1159 Type 2 diabetes mellitus with other circulatory complications: Secondary | ICD-10-CM | POA: Diagnosis not present

## 2022-08-02 DIAGNOSIS — Z7985 Long-term (current) use of injectable non-insulin antidiabetic drugs: Secondary | ICD-10-CM

## 2022-08-02 DIAGNOSIS — Z7984 Long term (current) use of oral hypoglycemic drugs: Secondary | ICD-10-CM

## 2022-08-02 DIAGNOSIS — Z6832 Body mass index (BMI) 32.0-32.9, adult: Secondary | ICD-10-CM

## 2022-08-02 DIAGNOSIS — I152 Hypertension secondary to endocrine disorders: Secondary | ICD-10-CM | POA: Diagnosis not present

## 2022-08-02 DIAGNOSIS — E669 Obesity, unspecified: Secondary | ICD-10-CM

## 2022-08-02 MED ORDER — VITAMIN D (ERGOCALCIFEROL) 1.25 MG (50000 UNIT) PO CAPS: 50000.0000 [IU] | ORAL_CAPSULE | ORAL | 0 refills | Status: DC

## 2022-08-02 MED ORDER — OZEMPIC (0.25 OR 0.5 MG/DOSE) 2 MG/3ML ~~LOC~~ SOPN: 0.5000 mg | PEN_INJECTOR | SUBCUTANEOUS | 0 refills | Status: DC

## 2022-08-20 NOTE — Progress Notes (Signed)
Chief Complaint:   OBESITY Marisa Andrews is here to discuss her progress with her obesity treatment plan along with follow-up of her obesity related diagnoses. Marisa Andrews is on the Category 3 Plan and states she is following her eating plan approximately 60% of the time. Marisa Andrews states she is walking 30 minutes 2-3 times per week.  Today's visit was #: 8 Starting weight: 221 lbs Starting date: 02/21/2022 Today's weight: 210 lbs Today's date: 08/02/2022 Total lbs lost to date: 11 Total lbs lost since last in-office visit: +1  Interim History: Pt has been very busy since her last appt. She has had many events and activities, and therefore has not been eating on plan. Pt wants to recommit to meal plan and follow category 3.  Subjective:   1. Vitamin D deficiency Marisa Andrews denies nausea, vomiting, and muscle weakness. She is on prescription Vit D.  2. Type 2 diabetes mellitus with hyperglycemia, without long-term current use of insulin (HCC) Marisa Andrews is on Ozempic with no GI side effects. She is also on Metformin. Pt is still able to take in indulgent foods.  3. Hypertension associated with diabetes (HCC) Pt is not on medication, but her BP is elevated. She reports BP likely normal at home (she has a cuff).  Assessment/Plan:   1. Vitamin D deficiency Low Vitamin D level contributes to fatigue and are associated with obesity, breast, and colon cancer. She agrees to continue to take prescription Vitamin D 50,000 IU every week and will follow-up for routine testing of Vitamin D, at least 2-3 times per year to avoid over-replacement.  Refill- Vitamin D, Ergocalciferol, (DRISDOL) 1.25 MG (50000 UNIT) CAPS capsule; Take 1 capsule (50,000 Units total) by mouth every 7 (seven) days.  Dispense: 4 capsule; Refill: 0  2. Type 2 diabetes mellitus with hyperglycemia, without long-term current use of insulin (HCC) Good blood sugar control is important to decrease the likelihood of diabetic complications such as  nephropathy, neuropathy, limb loss, blindness, coronary artery disease, and death. Intensive lifestyle modification including diet, exercise and weight loss are the first line of treatment for diabetes.  Increase Ozempic to 0.5 mg w - Semaglutide,0.25 or 0.5MG /DOS, (OZEMPIC, 0.25 OR 0.5 MG/DOSE,) 2 MG/3ML SOPN; Inject 0.5 mg into the skin once a week.  Dispense: 3 mL; Refill: 0  3. Hypertension associated with diabetes [facility]) Patient is to continue to measure and now log blood pressure at home.  She will bring log and her own blood pressure cuff to the next appointment to compare it's readings with reading on cuff at office.  4. Obesity with current BMI of 32.9 Marisa Andrews is currently in the action stage of change. As such, her goal is to continue with weight loss efforts. She has agreed to the Category 3 Plan.   Exercise goals: No exercise has been prescribed at this time.  Behavioral modification strategies: increasing lean protein intake, meal planning and cooking strategies, keeping healthy foods in the home, and planning for success.  Marisa Andrews has agreed to follow-up with our clinic in 3-4 weeks. She was informed of the importance of frequent follow-up visits to maximize her success with intensive lifestyle modifications for her multiple health conditions.   Objective:   Blood pressure (!) 145/82, pulse 78, temperature 98 F (36.7 C), height 5\' 7"  (1.702 m), weight 210 lb (95.3 kg), SpO2 100 %. Body mass index is 32.89 kg/m.  General: Cooperative, alert, well developed, in no acute distress. HEENT: Conjunctivae and lids unremarkable. Cardiovascular: Regular rhythm.  Lungs: Normal work of breathing. Neurologic: No focal deficits.   Lab Results  Component Value Date   CREATININE 0.78 02/21/2022   BUN 14 02/21/2022   NA 141 02/21/2022   K 4.6 02/21/2022   CL 101 02/21/2022   CO2 22 02/21/2022   Lab Results  Component Value Date   ALT 15 02/21/2022   AST 16 02/21/2022   ALKPHOS 85  02/21/2022   BILITOT 0.4 02/21/2022   Lab Results  Component Value Date   HGBA1C 6.8 01/04/2022   HGBA1C 6.4 11/16/2009   HGBA1C 6.5 11/30/2008   Lab Results  Component Value Date   INSULIN 29.2 (H) 02/21/2022   Lab Results  Component Value Date   TSH 1.550 02/21/2022   Lab Results  Component Value Date   CHOL 129 01/04/2022   HDL 30 (A) 01/04/2022   LDLCALC 73 01/04/2022   TRIG 129 01/04/2022   CHOLHDL 5 11/16/2009   Lab Results  Component Value Date   VD25OH 16.1 (L) 02/21/2022   Lab Results  Component Value Date   WBC 6.9 02/21/2022   HGB 14.4 02/21/2022   HCT 43.9 02/21/2022   MCV 84 02/21/2022   PLT 278 02/21/2022   Attestation Statements:   Reviewed by clinician on day of visit: allergies, medications, problem list, medical history, surgical history, family history, social history, and previous encounter notes.  I, Kathlene November, BS, CMA, am acting as transcriptionist for Coralie Common, MD.   I have reviewed the above documentation for accuracy and completeness, and I agree with the above. - Coralie Common, MD

## 2022-08-29 ENCOUNTER — Other Ambulatory Visit (INDEPENDENT_AMBULATORY_CARE_PROVIDER_SITE_OTHER): Payer: Self-pay | Admitting: Family Medicine

## 2022-08-29 DIAGNOSIS — E1165 Type 2 diabetes mellitus with hyperglycemia: Secondary | ICD-10-CM

## 2022-08-29 DIAGNOSIS — E559 Vitamin D deficiency, unspecified: Secondary | ICD-10-CM

## 2022-08-30 ENCOUNTER — Ambulatory Visit (INDEPENDENT_AMBULATORY_CARE_PROVIDER_SITE_OTHER): Payer: BC Managed Care – PPO | Admitting: Family Medicine

## 2022-08-30 ENCOUNTER — Encounter (INDEPENDENT_AMBULATORY_CARE_PROVIDER_SITE_OTHER): Payer: Self-pay | Admitting: Family Medicine

## 2022-08-30 VITALS — BP 125/77 | HR 86 | Temp 98.1°F | Ht 67.0 in | Wt 208.0 lb

## 2022-08-30 DIAGNOSIS — Z6832 Body mass index (BMI) 32.0-32.9, adult: Secondary | ICD-10-CM

## 2022-08-30 DIAGNOSIS — Z7985 Long-term (current) use of injectable non-insulin antidiabetic drugs: Secondary | ICD-10-CM

## 2022-08-30 DIAGNOSIS — E559 Vitamin D deficiency, unspecified: Secondary | ICD-10-CM

## 2022-08-30 DIAGNOSIS — E1165 Type 2 diabetes mellitus with hyperglycemia: Secondary | ICD-10-CM

## 2022-08-30 DIAGNOSIS — E669 Obesity, unspecified: Secondary | ICD-10-CM | POA: Diagnosis not present

## 2022-08-30 MED ORDER — VITAMIN D (ERGOCALCIFEROL) 1.25 MG (50000 UNIT) PO CAPS: 50000.0000 [IU] | ORAL_CAPSULE | ORAL | 0 refills | Status: DC

## 2022-08-30 MED ORDER — OZEMPIC (0.25 OR 0.5 MG/DOSE) 2 MG/3ML ~~LOC~~ SOPN: 0.5000 mg | PEN_INJECTOR | SUBCUTANEOUS | 0 refills | Status: DC

## 2022-09-12 NOTE — Progress Notes (Signed)
Chief Complaint:   OBESITY Marisa Andrews is here to discuss her progress with her obesity treatment plan along with follow-up of her obesity related diagnoses. Marisa Andrews is on the Category 3 Plan and states she is following her eating plan approximately 85% of the time. Marisa Andrews states she is walking 2 times per week.  Today's visit was #: 9 Starting weight: 221 lbs Starting date: 02/21/2022 Today's weight: 208 lbs Today's date: 08/30/2022 Total lbs lost to date: 13 lbs Total lbs lost since last in-office visit: 2  Interim History: Marisa Andrews got to stay home for the holidays but had to work [employer].  Followed plan at 85%.  Getting tired of a [place] sandwichs.  She did not like overnight oats.  No upcoming plans; still having to push herself to get all food in consistently.  Doing Fiber One and  Special K bars for a snack.  Subjective:   1. Vitamin D deficiency Marisa Andrews is currently taking prescription Vit D 50,000 IU once a week.  Denies any nausea, vomiting or muscle weakness. She notes fatigue.  2. Type 2 diabetes mellitus with hyperglycemia, without long-term current use of insulin ([place]) Marisa Andrews is on Ozempic.  PCP appointment in March 2024.  Assessment/Plan:   1. Vitamin D deficiency We will refill Vit D 50K IU once a week for 1 month with 0 refills.  -Refill Vitamin D, Ergocalciferol, (DRISDOL) 1.25 MG (50000 UNIT) CAPS capsule; Take 1 capsule (50,000 Units total) by mouth every 7 (seven) days.  Dispense: 4 capsule; Refill: 0  2. Type 2 diabetes mellitus with hyperglycemia, without long-term current use of insulin (HCC) We will refill Ozempic 0.5 mg SubQ once weekly for 1 month with 0 refills.  -Refill Semaglutide,0.25 or 0.5MG /DOS, (OZEMPIC, 0.25 OR 0.5 MG/DOSE,) 2 MG/3ML SOPN; Inject 0.5 mg into the skin once a week.  Dispense: 3 mL; Refill: 0  3. Obesity with current BMI of 32.6 Marisa Andrews is currently in the action stage of change. As such, her goal is to continue with weight loss efforts.  She has agreed to the Category 3 Plan.   Exercise goals: All adults should avoid inactivity. Some physical activity is better than none, and adults who participate in any amount of physical activity gain some health benefits.  Marisa Andrews is to start formulating a physical activity plan.  Behavioral modification strategies: increasing lean protein intake, meal planning and cooking strategies, keeping healthy foods in the home, and planning for success.  Marisa Andrews has agreed to follow-up with our clinic in 4 weeks. She was informed of the importance of frequent follow-up visits to maximize her success with intensive lifestyle modifications for her multiple health conditions.   Objective:   Blood pressure 125/77, pulse 86, temperature 98.1 F (36.7 C), height 5\' 7"  (1.702 m), weight 208 lb (94.3 kg), SpO2 97 %. Body mass index is 32.58 kg/m.  General: Cooperative, alert, well developed, in no acute distress. HEENT: Conjunctivae and lids unremarkable. Cardiovascular: Regular rhythm.  Lungs: Normal work of breathing. Neurologic: No focal deficits.   Lab Results  Component Value Date   CREATININE 0.78 02/21/2022   BUN 14 02/21/2022   NA 141 02/21/2022   K 4.6 02/21/2022   CL 101 02/21/2022   CO2 22 02/21/2022   Lab Results  Component Value Date   ALT 15 02/21/2022   AST 16 02/21/2022   ALKPHOS 85 02/21/2022   BILITOT 0.4 02/21/2022   Lab Results  Component Value Date   HGBA1C 6.8 01/04/2022  HGBA1C 6.4 11/16/2009   HGBA1C 6.5 11/30/2008   Lab Results  Component Value Date   INSULIN 29.2 (H) 02/21/2022   Lab Results  Component Value Date   TSH 1.550 02/21/2022   Lab Results  Component Value Date   CHOL 129 01/04/2022   HDL 30 (A) 01/04/2022   LDLCALC 73 01/04/2022   TRIG 129 01/04/2022   CHOLHDL 5 11/16/2009   Lab Results  Component Value Date   VD25OH 16.1 (L) 02/21/2022   Lab Results  Component Value Date   WBC 6.9 02/21/2022   HGB 14.4 02/21/2022   HCT 43.9  02/21/2022   MCV 84 02/21/2022   PLT 278 02/21/2022   No results found for: "IRON", "TIBC", "FERRITIN"  Attestation Statements:   Reviewed by clinician on day of visit: allergies, medications, problem list, medical history, surgical history, family history, social history, and previous encounter notes.  I, Elnora Morrison, RMA am acting as transcriptionist for Coralie Common, MD. I have reviewed the above documentation for accuracy and completeness, and I agree with the above. - Coralie Common, MD

## 2022-09-23 ENCOUNTER — Other Ambulatory Visit (INDEPENDENT_AMBULATORY_CARE_PROVIDER_SITE_OTHER): Payer: Self-pay | Admitting: Family Medicine

## 2022-09-23 DIAGNOSIS — E1165 Type 2 diabetes mellitus with hyperglycemia: Secondary | ICD-10-CM

## 2022-09-23 DIAGNOSIS — E559 Vitamin D deficiency, unspecified: Secondary | ICD-10-CM

## 2022-09-27 ENCOUNTER — Ambulatory Visit (INDEPENDENT_AMBULATORY_CARE_PROVIDER_SITE_OTHER): Payer: BC Managed Care – PPO | Admitting: Family Medicine

## 2022-09-27 ENCOUNTER — Encounter (INDEPENDENT_AMBULATORY_CARE_PROVIDER_SITE_OTHER): Payer: Self-pay | Admitting: Family Medicine

## 2022-09-27 VITALS — BP 132/82 | HR 89 | Temp 97.7°F | Ht 67.0 in | Wt 209.0 lb

## 2022-09-27 DIAGNOSIS — K5903 Drug induced constipation: Secondary | ICD-10-CM

## 2022-09-27 DIAGNOSIS — E559 Vitamin D deficiency, unspecified: Secondary | ICD-10-CM

## 2022-09-27 DIAGNOSIS — Z7985 Long-term (current) use of injectable non-insulin antidiabetic drugs: Secondary | ICD-10-CM

## 2022-09-27 DIAGNOSIS — E1165 Type 2 diabetes mellitus with hyperglycemia: Secondary | ICD-10-CM | POA: Diagnosis not present

## 2022-09-27 DIAGNOSIS — E669 Obesity, unspecified: Secondary | ICD-10-CM | POA: Diagnosis not present

## 2022-09-27 DIAGNOSIS — Z6832 Body mass index (BMI) 32.0-32.9, adult: Secondary | ICD-10-CM

## 2022-09-27 MED ORDER — OZEMPIC (0.25 OR 0.5 MG/DOSE) 2 MG/3ML ~~LOC~~ SOPN: 0.5000 mg | PEN_INJECTOR | SUBCUTANEOUS | 0 refills | Status: DC

## 2022-09-27 MED ORDER — VITAMIN D (ERGOCALCIFEROL) 1.25 MG (50000 UNIT) PO CAPS: 50000.0000 [IU] | ORAL_CAPSULE | ORAL | 0 refills | Status: DC

## 2022-09-27 MED ORDER — POLYETHYLENE GLYCOL 3350 17 GM/SCOOP PO POWD: 17.0000 g | Freq: Two times a day (BID) | ORAL | 1 refills | Status: AC | PRN

## 2022-09-27 NOTE — Progress Notes (Deleted)
Patient has been working quite a bit the last few weeks.  Following Cat 3 most of the time.  Experiencing some significant constipation and isn't sure if it is Ozempic or not.  Not able to get all the food in consistently.  She threw up once. Denies consistent abdominal pain.  Next few weeks she only has work coming up.  Denies any events or activities.  She is going on a short vacation in May. Seeing her PCP in March.

## 2022-10-09 NOTE — Progress Notes (Signed)
Chief Complaint:   OBESITY Marisa Andrews is here to discuss her progress with her obesity treatment plan along with follow-up of her obesity related diagnoses. Marisa Andrews is on the Category 3 Plan and states she is following her eating plan approximately 85% of the time. Marisa Andrews states she is walking at work 2-3 miles 2-5 times per week.  Today's visit was #: 10 Starting weight: 221 lbs Starting date: 02/21/2022 Today's weight: 209 lbs Today's date: 09/27/2022 Total lbs lost to date: 12 lbs Total lbs lost since last in-office visit: 0  Interim History: Marisa Andrews has been working quite a bit the last few weeks.  Following Cat 3 most of the time.  Experiencing some significant constipation and isn't sure if it is Ozempic or not.  Not able to get all the food in consistently.  She threw up once. Denies consistent abdominal pain.  Next few weeks she only has work coming up.  Denies any events or activities.  She is going on a short vacation in May. Seeing her PCP in March.    Subjective:   1. Drug-induced constipation Decreased frequency of bowel movements in last few weeks.  Has not taken anything.  2. Type 2 diabetes mellitus with hyperglycemia, without long-term current use of insulin (HCC) Last A1c elevated.  On Ozempic 0.5 mg (some constipation noted).  3. Vitamin D deficiency Marisa Andrews is currently taking prescription Vit D 50,000 IU once a week.  Denies any nausea, vomiting or muscle weakness.  She notes fatigue.  Assessment/Plan:   1. Drug-induced constipation Patient instructed to START Miralax 17 gm daily for 1 month with 0 refills.  -Start polyethylene glycol powder (GLYCOLAX/MIRALAX) 17 GM/SCOOP powder; Take 17 g by mouth 2 (two) times daily as needed.  Dispense: 3350 g; Refill: 1  2. Type 2 diabetes mellitus with hyperglycemia, without long-term current use of insulin (HCC) Will refill Ozempic 0.5 mg SubQ once a week for 1 month with 0 refills.  -Refill Semaglutide,0.25 or 0.'5MG'$ /DOS,  (OZEMPIC, 0.25 OR 0.5 MG/DOSE,) 2 MG/3ML SOPN; Inject 0.5 mg into the skin once a week.  Dispense: 3 mL; Refill: 0  3. Vitamin D deficiency We will refill Vit D 50K IU once a week for 1 month with 0 refills.  -Refill Vitamin D, Ergocalciferol, (DRISDOL) 1.25 MG (50000 UNIT) CAPS capsule; Take 1 capsule (50,000 Units total) by mouth every 7 (seven) days.  Dispense: 4 capsule; Refill: 0  4. Obesity with current BMI of 32.8 Marisa Andrews is currently in the action stage of change. As such, her goal is to continue with weight loss efforts. She has agreed to the Category 3 Plan.   Exercise goals: All adults should avoid inactivity. Some physical activity is better than none, and adults who participate in any amount of physical activity gain some health benefits.  Behavioral modification strategies: increasing lean protein intake, meal planning and cooking strategies, keeping healthy foods in the home, and planning for success.  Marisa Andrews has agreed to follow-up with our clinic in 3 weeks. She was informed of the importance of frequent follow-up visits to maximize her success with intensive lifestyle modifications for her multiple health conditions.   Objective:   Blood pressure 132/82, pulse 89, temperature 97.7 F (36.5 C), height '5\' 7"'$  (1.702 m), weight 209 lb (94.8 kg), SpO2 100 %. Body mass index is 32.73 kg/m.  General: Cooperative, alert, well developed, in no acute distress. HEENT: Conjunctivae and lids unremarkable. Cardiovascular: Regular rhythm.  Lungs: Normal work of breathing. Neurologic:  No focal deficits.   Lab Results  Component Value Date   CREATININE 0.78 02/21/2022   BUN 14 02/21/2022   NA 141 02/21/2022   K 4.6 02/21/2022   CL 101 02/21/2022   CO2 22 02/21/2022   Lab Results  Component Value Date   ALT 15 02/21/2022   AST 16 02/21/2022   ALKPHOS 85 02/21/2022   BILITOT 0.4 02/21/2022   Lab Results  Component Value Date   HGBA1C 6.8 01/04/2022   HGBA1C 6.4 11/16/2009    HGBA1C 6.5 11/30/2008   Lab Results  Component Value Date   INSULIN 29.2 (H) 02/21/2022   Lab Results  Component Value Date   TSH 1.550 02/21/2022   Lab Results  Component Value Date   CHOL 129 01/04/2022   HDL 30 (A) 01/04/2022   LDLCALC 73 01/04/2022   TRIG 129 01/04/2022   CHOLHDL 5 11/16/2009   Lab Results  Component Value Date   VD25OH 16.1 (L) 02/21/2022   Lab Results  Component Value Date   WBC 6.9 02/21/2022   HGB 14.4 02/21/2022   HCT 43.9 02/21/2022   MCV 84 02/21/2022   PLT 278 02/21/2022   No results found for: "IRON", "TIBC", "FERRITIN"  Attestation Statements:   Reviewed by clinician on day of visit: allergies, medications, problem list, medical history, surgical history, family history, social history, and previous encounter notes.  I, Elnora Morrison, RMA am acting as transcriptionist for Coralie Common, MD.  I have reviewed the above documentation for accuracy and completeness, and I agree with the above. - Coralie Common, MD

## 2022-10-25 ENCOUNTER — Encounter (INDEPENDENT_AMBULATORY_CARE_PROVIDER_SITE_OTHER): Payer: Self-pay | Admitting: Family Medicine

## 2022-10-25 ENCOUNTER — Ambulatory Visit (INDEPENDENT_AMBULATORY_CARE_PROVIDER_SITE_OTHER): Payer: BC Managed Care – PPO | Admitting: Family Medicine

## 2022-10-25 VITALS — BP 120/78 | HR 92 | Temp 97.9°F | Ht 67.0 in | Wt 209.0 lb

## 2022-10-25 DIAGNOSIS — Z6832 Body mass index (BMI) 32.0-32.9, adult: Secondary | ICD-10-CM | POA: Diagnosis not present

## 2022-10-25 DIAGNOSIS — E559 Vitamin D deficiency, unspecified: Secondary | ICD-10-CM | POA: Diagnosis not present

## 2022-10-25 DIAGNOSIS — E669 Obesity, unspecified: Secondary | ICD-10-CM

## 2022-10-25 DIAGNOSIS — Z7985 Long-term (current) use of injectable non-insulin antidiabetic drugs: Secondary | ICD-10-CM

## 2022-10-25 DIAGNOSIS — E1165 Type 2 diabetes mellitus with hyperglycemia: Secondary | ICD-10-CM | POA: Diagnosis not present

## 2022-10-25 MED ORDER — VITAMIN D (ERGOCALCIFEROL) 1.25 MG (50000 UNIT) PO CAPS: 50000.0000 [IU] | ORAL_CAPSULE | ORAL | 0 refills | Status: DC

## 2022-10-25 MED ORDER — OZEMPIC (0.25 OR 0.5 MG/DOSE) 2 MG/3ML ~~LOC~~ SOPN: 0.5000 mg | PEN_INJECTOR | SUBCUTANEOUS | 0 refills | Status: DC

## 2022-10-25 NOTE — Progress Notes (Deleted)
Patient has been mostly working over the last few weeks.  She has been following Category 3 and trying to get all the food in.  She doesn't care much for the yogurt consistently; some days she can eat it all and other days she can't.  Trying to use everything bagel seasoning in cottage cheese to get that in more consistently.  Upon review of bioimpedence scale strip she has lost fat and maintained or even increased muscle mass.  No upcoming plans until May.  Has upcoming appointment with Dr. Ardeth Perfect in the next few weeks.

## 2022-10-26 ENCOUNTER — Other Ambulatory Visit (INDEPENDENT_AMBULATORY_CARE_PROVIDER_SITE_OTHER): Payer: Self-pay | Admitting: Family Medicine

## 2022-10-26 DIAGNOSIS — E1165 Type 2 diabetes mellitus with hyperglycemia: Secondary | ICD-10-CM

## 2022-10-26 DIAGNOSIS — E559 Vitamin D deficiency, unspecified: Secondary | ICD-10-CM

## 2022-11-01 NOTE — Progress Notes (Signed)
Chief Complaint:   OBESITY Marisa Andrews is here to discuss her progress with her obesity treatment plan along with follow-up of her obesity related diagnoses. Marisa Andrews is on the Category 3 Plan and states she is following her eating plan approximately 85% of the time. Marisa Andrews states she is walking at work 3 miles each week.  Today's visit was #: 51 Starting weight: 221 LBS Starting date: 02/21/2022 Today's weight: 209 LBS Today's date: 10/25/2022 Total lbs lost to date: 12 LBS Total lbs lost since last in-office visit: 0  Interim History:  Patient has been mostly working over the last few weeks.  She has been following Category 3 and trying to get all the food in.  She doesn't care much for the yogurt consistently; some days she can eat it all and other days she can't.  Trying to use everything bagel seasoning in cottage cheese to get that in more consistently.  Upon review of bioimpedence scale strip she has lost fat and maintained or even increased muscle mass.  No upcoming plans until May.  Has upcoming appointment with Dr. Ardeth Perfect in the next few weeks.   Subjective:   1. Vitamin D deficiency Patient is taking prescription vitamin D.  Patient denies nausea, vomiting, muscle weakness, but is positive for fatigue.  2. Type 2 diabetes mellitus with hyperglycemia, without long-term current use of insulin ([place]) Patient is on Ozempic with some constipation.  Last A1c was 8 months ago.  Patient has upcoming PCP appointment.  Assessment/Plan:   1. Vitamin D deficiency Refill- Vitamin D, Ergocalciferol, (DRISDOL) 1.25 MG (50000 UNIT) CAPS capsule; Take 1 capsule (50,000 Units total) by mouth every 7 (seven) days.  Dispense: 4 capsule; Refill: 0  2. Type 2 diabetes mellitus with hyperglycemia, without long-term current use of insulin (HCC) Refill- Semaglutide,0.25 or 0.5MG /DOS, (OZEMPIC, 0.25 OR 0.5 MG/DOSE,) 2 MG/3ML SOPN; Inject 0.5 mg into the skin once a week.  Dispense: 3 mL; Refill: 0  3.  Obesity with current BMI of 32.8 Marisa Andrews is currently in the action stage of change. As such, her goal is to continue with weight loss efforts. She has agreed to the Category 3 Plan.   Exercise goals: All adults should avoid inactivity. Some physical activity is better than none, and adults who participate in any amount of physical activity gain some health benefits.  Behavioral modification strategies: increasing lean protein intake, meal planning and cooking strategies, keeping healthy foods in the home, and planning for success.  Marisa Andrews has agreed to follow-up with our clinic in 3-4 weeks. She was informed of the importance of frequent follow-up visits to maximize her success with intensive lifestyle modifications for her multiple health conditions.   Objective:   Blood pressure 120/78, pulse 92, temperature 97.9 F (36.6 C), height 5\' 7"  (1.702 m), weight 209 lb (94.8 kg), SpO2 97 %. Body mass index is 32.73 kg/m.  General: Cooperative, alert, well developed, in no acute distress. HEENT: Conjunctivae and lids unremarkable. Cardiovascular: Regular rhythm.  Lungs: Normal work of breathing. Neurologic: No focal deficits.   Lab Results  Component Value Date   CREATININE 0.78 02/21/2022   BUN 14 02/21/2022   NA 141 02/21/2022   K 4.6 02/21/2022   CL 101 02/21/2022   CO2 22 02/21/2022   Lab Results  Component Value Date   ALT 15 02/21/2022   AST 16 02/21/2022   ALKPHOS 85 02/21/2022   BILITOT 0.4 02/21/2022   Lab Results  Component Value Date  HGBA1C 6.8 01/04/2022   HGBA1C 6.4 11/16/2009   HGBA1C 6.5 11/30/2008   Lab Results  Component Value Date   INSULIN 29.2 (H) 02/21/2022   Lab Results  Component Value Date   TSH 1.550 02/21/2022   Lab Results  Component Value Date   CHOL 129 01/04/2022   HDL 30 (A) 01/04/2022   LDLCALC 73 01/04/2022   TRIG 129 01/04/2022   CHOLHDL 5 11/16/2009   Lab Results  Component Value Date   VD25OH 16.1 (L) 02/21/2022   Lab  Results  Component Value Date   WBC 6.9 02/21/2022   HGB 14.4 02/21/2022   HCT 43.9 02/21/2022   MCV 84 02/21/2022   PLT 278 02/21/2022   No results found for: "IRON", "TIBC", "FERRITIN"  Attestation Statements:   Reviewed by clinician on day of visit: allergies, medications, problem list, medical history, surgical history, family history, social history, and previous encounter notes.  I, Davy Pique, RMA, am acting as transcriptionist for Coralie Common, MD. I have reviewed the above documentation for accuracy and completeness, and I agree with the above. - Coralie Common, MD

## 2022-11-18 ENCOUNTER — Other Ambulatory Visit (INDEPENDENT_AMBULATORY_CARE_PROVIDER_SITE_OTHER): Payer: Self-pay | Admitting: Family Medicine

## 2022-11-18 DIAGNOSIS — E1165 Type 2 diabetes mellitus with hyperglycemia: Secondary | ICD-10-CM

## 2022-11-18 DIAGNOSIS — E559 Vitamin D deficiency, unspecified: Secondary | ICD-10-CM

## 2022-11-22 ENCOUNTER — Encounter (INDEPENDENT_AMBULATORY_CARE_PROVIDER_SITE_OTHER): Payer: Self-pay | Admitting: Family Medicine

## 2022-11-22 ENCOUNTER — Ambulatory Visit (INDEPENDENT_AMBULATORY_CARE_PROVIDER_SITE_OTHER): Payer: BC Managed Care – PPO | Admitting: Family Medicine

## 2022-11-22 VITALS — BP 121/78 | HR 85 | Temp 97.6°F | Ht 67.0 in | Wt 210.0 lb

## 2022-11-22 DIAGNOSIS — E1165 Type 2 diabetes mellitus with hyperglycemia: Secondary | ICD-10-CM | POA: Diagnosis not present

## 2022-11-22 DIAGNOSIS — Z7984 Long term (current) use of oral hypoglycemic drugs: Secondary | ICD-10-CM

## 2022-11-22 DIAGNOSIS — E559 Vitamin D deficiency, unspecified: Secondary | ICD-10-CM

## 2022-11-22 DIAGNOSIS — E669 Obesity, unspecified: Secondary | ICD-10-CM | POA: Diagnosis not present

## 2022-11-22 DIAGNOSIS — Z7985 Long-term (current) use of injectable non-insulin antidiabetic drugs: Secondary | ICD-10-CM

## 2022-11-22 DIAGNOSIS — Z6832 Body mass index (BMI) 32.0-32.9, adult: Secondary | ICD-10-CM

## 2022-11-22 MED ORDER — VITAMIN D (ERGOCALCIFEROL) 1.25 MG (50000 UNIT) PO CAPS: 50000.0000 [IU] | ORAL_CAPSULE | ORAL | 0 refills | Status: DC

## 2022-11-22 MED ORDER — OZEMPIC (0.25 OR 0.5 MG/DOSE) 2 MG/3ML ~~LOC~~ SOPN: 0.5000 mg | PEN_INJECTOR | SUBCUTANEOUS | 0 refills | Status: DC

## 2022-11-22 NOTE — Progress Notes (Signed)
Chief Complaint:   OBESITY Marisa Andrews is here to discuss her progress with her obesity treatment plan along with follow-up of her obesity related diagnoses. Marisa Andrews is on the Category 3 Plan and states she is following her eating plan approximately 80% of the time. Marisa Andrews states she is walking 3 miles 2-3 times per week.  Today's visit was #: 12 Starting weight: 221 LBS Starting date: 02/21/2022 Today's weight: 210 LBS Today's date: 11/22/2022 Total lbs lost to date: 11 LBS Total lbs lost since last in-office visit: +1 LB  Interim History: Patient has been more tired over the last month.  She voices that she has not done much out of the ordinary. Eats off plan a few times but minimally- mostly when out running errands. Not always getting in the full 8-10oz at supper.  She has tried new recipes and putting food together in a different way to make it not so much meat.  She is going to [facility] in a few weeks as a girls trip.  Sometimes she isn't hungry and then ends up skipping meals.  Subjective:   1. Type 2 diabetes mellitus with hyperglycemia, without long-term current use of insulin Patient is on Ozempic and metformin.  She has experienced no side effects of these medications.  She is not experiencing satiety on Ozempic.  2. Vitamin D deficiency Patient is on prescription vitamin D.  Patient denies nausea, vomiting, muscle weakness but is positive for fatigue.  Patient last vitamin D level was low.  Patient has an appointment with PCP next week.  Assessment/Plan:   1. Type 2 diabetes mellitus with hyperglycemia, without long-term current use of insulin Refill- Semaglutide,0.25 or 0.5MG /DOS, (OZEMPIC, 0.25 OR 0.5 MG/DOSE,) 2 MG/3ML SOPN; Inject 0.5 mg into the skin once a week.  Dispense: 3 mL; Refill: 0  2. Vitamin D deficiency Refill- Vitamin D, Ergocalciferol, (DRISDOL) 1.25 MG (50000 UNIT) CAPS capsule; Take 1 capsule (50,000 Units total) by mouth every 7 (seven) days.  Dispense:  4 capsule; Refill: 0  3. Obesity with current BMI of 32.9 Marisa Andrews is currently in the action stage of change. As such, her goal is to continue with weight loss efforts. She has agreed to the Category 3 Plan.   Exercise goals:  As is.  Behavioral modification strategies: increasing lean protein intake, meal planning and cooking strategies, keeping healthy foods in the home, and planning for success.  Marisa Andrews has agreed to follow-up with our clinic in 4 weeks. She was informed of the importance of frequent follow-up visits to maximize her success with intensive lifestyle modifications for her multiple health conditions.   Objective:   Blood pressure 121/78, pulse 85, temperature 97.6 F (36.4 C), height 5\' 7"  (1.702 m), weight 210 lb (95.3 kg), SpO2 100 %. Body mass index is 32.89 kg/m.  General: Cooperative, alert, well developed, in no acute distress. HEENT: Conjunctivae and lids unremarkable. Cardiovascular: Regular rhythm.  Lungs: Normal work of breathing. Neurologic: No focal deficits.   Lab Results  Component Value Date   CREATININE 0.78 02/21/2022   BUN 14 02/21/2022   NA 141 02/21/2022   K 4.6 02/21/2022   CL 101 02/21/2022   CO2 22 02/21/2022   Lab Results  Component Value Date   ALT 15 02/21/2022   AST 16 02/21/2022   ALKPHOS 85 02/21/2022   BILITOT 0.4 02/21/2022   Lab Results  Component Value Date   HGBA1C 6.8 01/04/2022   HGBA1C 6.4 11/16/2009   HGBA1C 6.5  11/30/2008   Lab Results  Component Value Date   INSULIN 29.2 (H) 02/21/2022   Lab Results  Component Value Date   TSH 1.550 02/21/2022   Lab Results  Component Value Date   CHOL 129 01/04/2022   HDL 30 (A) 01/04/2022   LDLCALC 73 01/04/2022   TRIG 129 01/04/2022   CHOLHDL 5 11/16/2009   Lab Results  Component Value Date   VD25OH 16.1 (L) 02/21/2022   Lab Results  Component Value Date   WBC 6.9 02/21/2022   HGB 14.4 02/21/2022   HCT 43.9 02/21/2022   MCV 84 02/21/2022   PLT 278  02/21/2022   No results found for: "IRON", "TIBC", "FERRITIN"  Attestation Statements:   Reviewed by clinician on day of visit: allergies, medications, problem list, medical history, surgical history, family history, social history, and previous encounter notes.  I, Malcolm Metrorina Spence, RMA, am acting as transcriptionist for Reuben LikesAlexandria Merdis Snodgrass, MD.  I have reviewed the above documentation for accuracy and completeness, and I agree with the above. - Reuben LikesAlexandria Tamaria Dunleavy, MD

## 2022-12-19 ENCOUNTER — Other Ambulatory Visit (INDEPENDENT_AMBULATORY_CARE_PROVIDER_SITE_OTHER): Payer: Self-pay | Admitting: Family Medicine

## 2022-12-19 DIAGNOSIS — E1165 Type 2 diabetes mellitus with hyperglycemia: Secondary | ICD-10-CM

## 2022-12-19 DIAGNOSIS — E559 Vitamin D deficiency, unspecified: Secondary | ICD-10-CM

## 2022-12-20 ENCOUNTER — Ambulatory Visit (INDEPENDENT_AMBULATORY_CARE_PROVIDER_SITE_OTHER): Payer: BC Managed Care – PPO | Admitting: Family Medicine

## 2022-12-20 ENCOUNTER — Encounter (INDEPENDENT_AMBULATORY_CARE_PROVIDER_SITE_OTHER): Payer: Self-pay | Admitting: Family Medicine

## 2022-12-20 VITALS — BP 123/80 | HR 87 | Temp 97.6°F | Ht 67.0 in | Wt 209.0 lb

## 2022-12-20 DIAGNOSIS — E1165 Type 2 diabetes mellitus with hyperglycemia: Secondary | ICD-10-CM | POA: Diagnosis not present

## 2022-12-20 DIAGNOSIS — E669 Obesity, unspecified: Secondary | ICD-10-CM

## 2022-12-20 DIAGNOSIS — E559 Vitamin D deficiency, unspecified: Secondary | ICD-10-CM

## 2022-12-20 DIAGNOSIS — Z6832 Body mass index (BMI) 32.0-32.9, adult: Secondary | ICD-10-CM

## 2022-12-20 DIAGNOSIS — Z7985 Long-term (current) use of injectable non-insulin antidiabetic drugs: Secondary | ICD-10-CM

## 2022-12-20 DIAGNOSIS — E66811 Obesity, class 1: Secondary | ICD-10-CM

## 2022-12-20 MED ORDER — OZEMPIC (0.25 OR 0.5 MG/DOSE) 2 MG/3ML ~~LOC~~ SOPN: 0.5000 mg | PEN_INJECTOR | SUBCUTANEOUS | 0 refills | Status: DC

## 2022-12-20 MED ORDER — VITAMIN D (ERGOCALCIFEROL) 1.25 MG (50000 UNIT) PO CAPS: 50000.0000 [IU] | ORAL_CAPSULE | ORAL | 0 refills | Status: DC

## 2022-12-20 NOTE — Progress Notes (Signed)
Chief Complaint:   OBESITY Marisa Andrews is here to discuss her progress with her obesity treatment plan along with follow-up of her obesity related diagnoses. Marisa Andrews is on the Category 3 Plan and states she is following her eating plan approximately 75% of the time. Marisa Andrews states she is walking 1 mile 3-5 times per week.    Today's visit was #: 13 Starting weight: 221 lbs Starting date: 02/21/2022 Today's weight: 209 lbs Today's date: 12/20/2022 Total lbs lost to date: 12 Total lbs lost since last in-office visit: 1  Interim History: Patient has been mostly working over the last few weeks- voices she went to [employer] with 2 teenage girls.  She mentions she felt tired from being out and about. Aside from her trip to [facility] she mentions she tried to stay focused on food choices and getting protein in.  Isn't sure if quantity was there in terms of following plan. She mentions she doesn't have much options in terms of availability of exercise equipment at work.   Subjective:   1. Type 2 diabetes mellitus with hyperglycemia, without long-term current use of insulin (HCC) Marisa Andrews's recent A1c was 6.5.  No GI side effects were noted.  2. Vitamin D deficiency Marisa Andrews is on prescription vitamin D.  Her last vitamin D level was 16.1.  Assessment/Plan:   1. Type 2 diabetes mellitus with hyperglycemia, without long-term current use of insulin (HCC) We will refill Ozempic at 0.5 mg subcu weekly for 1 month.  - Semaglutide,0.25 or 0.5MG /DOS, (OZEMPIC, 0.25 OR 0.5 MG/DOSE,) 2 MG/3ML SOPN; Inject 0.5 mg into the skin once a week.  Dispense: 3 mL; Refill: 0  2. Vitamin D deficiency We will refill prescription vitamin D for 1 month, and patient will need labs at her next appointment.  - Vitamin D, Ergocalciferol, (DRISDOL) 1.25 MG (50000 UNIT) CAPS capsule; Take 1 capsule (50,000 Units total) by mouth every 7 (seven) days.  Dispense: 4 capsule; Refill: 0  3. BMI 32.0-32.9,adult  4. Obesity with  starting BMI of 34.7 Marisa Andrews is currently in the action stage of change. As such, her goal is to continue with weight loss efforts. She has agreed to the Category 3 Plan.   Exercise goals: Initiation of resistance training 3-4 times per week for 10 to 15 minutes.  Behavioral modification strategies: increasing lean protein intake, meal planning and cooking strategies, better snacking choices, and planning for success.  Marisa Andrews has agreed to follow-up with our clinic in 3 weeks. She was informed of the importance of frequent follow-up visits to maximize her success with intensive lifestyle modifications for her multiple health conditions.   Objective:   Blood pressure 123/80, pulse 87, temperature 97.6 F (36.4 C), height 5\' 7"  (1.702 m), weight 209 lb (94.8 kg), SpO2 98 %. Body mass index is 32.73 kg/m.  General: Cooperative, alert, well developed, in no acute distress. HEENT: Conjunctivae and lids unremarkable. Cardiovascular: Regular rhythm.  Lungs: Normal work of breathing. Neurologic: No focal deficits.   Lab Results  Component Value Date   CREATININE 0.78 02/21/2022   BUN 14 02/21/2022   NA 141 02/21/2022   K 4.6 02/21/2022   CL 101 02/21/2022   CO2 22 02/21/2022   Lab Results  Component Value Date   ALT 15 02/21/2022   AST 16 02/21/2022   ALKPHOS 85 02/21/2022   BILITOT 0.4 02/21/2022   Lab Results  Component Value Date   HGBA1C 6.8 01/04/2022   HGBA1C 6.4 11/16/2009  HGBA1C 6.5 11/30/2008   Lab Results  Component Value Date   INSULIN 29.2 (H) 02/21/2022   Lab Results  Component Value Date   TSH 1.550 02/21/2022   Lab Results  Component Value Date   CHOL 129 01/04/2022   HDL 30 (A) 01/04/2022   LDLCALC 73 01/04/2022   TRIG 129 01/04/2022   CHOLHDL 5 11/16/2009   Lab Results  Component Value Date   VD25OH 16.1 (L) 02/21/2022   Lab Results  Component Value Date   WBC 6.9 02/21/2022   HGB 14.4 02/21/2022   HCT 43.9 02/21/2022   MCV 84 02/21/2022    PLT 278 02/21/2022   No results found for: "IRON", "TIBC", "FERRITIN"  Attestation Statements:   Reviewed by clinician on day of visit: allergies, medications, problem list, medical history, surgical history, family history, social history, and previous encounter notes.   I, Burt Knack, am acting as transcriptionist for Reuben Likes, MD.  I have reviewed the above documentation for accuracy and completeness, and I agree with the above. - Reuben Likes, MD

## 2023-01-17 ENCOUNTER — Ambulatory Visit (INDEPENDENT_AMBULATORY_CARE_PROVIDER_SITE_OTHER): Payer: BC Managed Care – PPO | Admitting: Family Medicine

## 2023-01-17 ENCOUNTER — Encounter (INDEPENDENT_AMBULATORY_CARE_PROVIDER_SITE_OTHER): Payer: Self-pay | Admitting: Family Medicine

## 2023-01-17 VITALS — BP 138/83 | HR 83 | Temp 97.5°F | Ht 67.0 in | Wt 208.0 lb

## 2023-01-17 DIAGNOSIS — Z7984 Long term (current) use of oral hypoglycemic drugs: Secondary | ICD-10-CM

## 2023-01-17 DIAGNOSIS — E559 Vitamin D deficiency, unspecified: Secondary | ICD-10-CM

## 2023-01-17 DIAGNOSIS — Z6832 Body mass index (BMI) 32.0-32.9, adult: Secondary | ICD-10-CM | POA: Diagnosis not present

## 2023-01-17 DIAGNOSIS — E1165 Type 2 diabetes mellitus with hyperglycemia: Secondary | ICD-10-CM | POA: Diagnosis not present

## 2023-01-17 DIAGNOSIS — E669 Obesity, unspecified: Secondary | ICD-10-CM | POA: Diagnosis not present

## 2023-01-17 DIAGNOSIS — Z7985 Long-term (current) use of injectable non-insulin antidiabetic drugs: Secondary | ICD-10-CM

## 2023-01-17 MED ORDER — OZEMPIC (0.25 OR 0.5 MG/DOSE) 2 MG/3ML ~~LOC~~ SOPN
0.5000 mg | PEN_INJECTOR | SUBCUTANEOUS | 0 refills | Status: AC
Start: 2023-01-17 — End: ?

## 2023-01-17 MED ORDER — VITAMIN D (ERGOCALCIFEROL) 1.25 MG (50000 UNIT) PO CAPS
50000.0000 [IU] | ORAL_CAPSULE | ORAL | 0 refills | Status: AC
Start: 2023-01-17 — End: ?

## 2023-01-17 MED ORDER — METFORMIN HCL ER (MOD) 1000 MG PO TB24
1000.0000 mg | ORAL_TABLET | Freq: Two times a day (BID) | ORAL | 0 refills | Status: AC
Start: 2023-01-17 — End: ?

## 2023-01-17 NOTE — Progress Notes (Unsigned)
Chief Complaint:   OBESITY Marisa Andrews is here to discuss her progress with her obesity treatment plan along with follow-up of her obesity related diagnoses. Marisa Andrews is on the Category 3 Plan and states she is following her eating plan approximately 75-80% of the time. Marisa Andrews states she is walking 4 miles per week.  Today's visit was #: 14 Starting weight: 221 lbs Starting date: 02/22/2012 Today's weight: 208 lbs Today's date: 01/17/2023 Total lbs lost to date: 13 Total lbs lost since last in-office visit: 1  Interim History: Patient has mostly been working over the last month.  She mentions that she is stressed due to short staffing. Food wise she has been trying to change things up- bagel thins with cream cheese. She is trying to incorporate yogurt in.  She realizes she hasn't gotten in the protein quantity for breakfast with this option.  For lunch she is doing chicken and salad and last night she is doing a pizza.  Marisa Andrews is on Marisa Andrews.   Subjective:   1. Type 2 diabetes mellitus with hyperglycemia, without long-term current use of insulin (HCC) Patient is on combination Ozempic and metformin.  She denies GI side effects now that she is taking MiraLAX.  Her last A1c in April was of 6.5.  2. Vitamin D deficiency Patient is on prescription vitamin D every week but she has not had labs since last year.  She notes fatigue.  Assessment/Plan:   1. Type 2 diabetes mellitus with hyperglycemia, without long-term current use of insulin (HCC) Patient will continue her medications, and we will refill Ozempic for 1 month and metformin for 90 days.  - metFORMIN (GLUMETZA) 1000 MG (MOD) 24 hr tablet; Take 1 tablet (1,000 mg total) by mouth 2 (two) times daily with a meal.  Dispense: 180 tablet; Refill: 0 - Semaglutide,0.25 or 0.5MG /DOS, (OZEMPIC, 0.25 OR 0.5 MG/DOSE,) 2 MG/3ML SOPN; Inject 0.5 mg into the skin once a week.  Dispense: 3 mL; Refill: 0  2. Vitamin D deficiency Patient  agreed to change prescription vitamin D to 50,000 IU every 14 days, and we will refill for 90 days.  She will have labs repeated in October with her PCP.  - Vitamin D, Ergocalciferol, (DRISDOL) 1.25 MG (50000 UNIT) CAPS capsule; Take 1 capsule (50,000 Units total) by mouth every 14 (fourteen) days.  Dispense: 6 capsule; Refill: 0  3. BMI 32.0-32.9,adult  4. Obesity with starting BMI of 34.7 Marisa Andrews is currently in the action stage of change. As such, her goal is to continue with weight loss efforts. She has agreed to the Category 3 Plan.   Exercise goals: Patient is to increase resistance exercises to 3 times per week.  Behavioral modification strategies: increasing lean protein intake, meal planning and cooking strategies, keeping healthy foods in the home, and planning for success.  Marisa Andrews has agreed to follow-up with our clinic in 5 weeks. She was informed of the importance of frequent follow-up visits to maximize her success with intensive lifestyle modifications for her multiple health conditions.   Objective:   Blood pressure 138/83, pulse 83, temperature (!) 97.5 F (36.4 C), height 5\' 7"  (1.702 m), weight 208 lb (94.3 kg), SpO2 99 %. Body mass index is 32.58 kg/m.  General: Cooperative, alert, well developed, in no acute distress. HEENT: Conjunctivae and lids unremarkable. Cardiovascular: Regular rhythm.  Lungs: Normal work of breathing. Neurologic: No focal deficits.   Lab Results  Component Value Date   CREATININE 0.78 02/21/2022  BUN 14 02/21/2022   NA 141 02/21/2022   K 4.6 02/21/2022   CL 101 02/21/2022   CO2 22 02/21/2022   Lab Results  Component Value Date   ALT 15 02/21/2022   AST 16 02/21/2022   ALKPHOS 85 02/21/2022   BILITOT 0.4 02/21/2022   Lab Results  Component Value Date   HGBA1C 6.8 01/04/2022   HGBA1C 6.4 11/16/2009   HGBA1C 6.5 11/30/2008   Lab Results  Component Value Date   INSULIN 29.2 (H) 02/21/2022   Lab Results  Component Value Date    TSH 1.550 02/21/2022   Lab Results  Component Value Date   CHOL 129 01/04/2022   HDL 30 (A) 01/04/2022   LDLCALC 73 01/04/2022   TRIG 129 01/04/2022   CHOLHDL 5 11/16/2009   Lab Results  Component Value Date   VD25OH 16.1 (L) 02/21/2022   Lab Results  Component Value Date   WBC 6.9 02/21/2022   HGB 14.4 02/21/2022   HCT 43.9 02/21/2022   MCV 84 02/21/2022   PLT 278 02/21/2022   No results found for: "IRON", "TIBC", "FERRITIN"  Attestation Statements:   Reviewed by clinician on day of visit: allergies, medications, problem list, medical history, surgical history, family history, social history, and previous encounter notes.   I, Burt Knack, am acting as transcriptionist for Reuben Likes, MD.  I have reviewed the above documentation for accuracy and completeness, and I agree with the above. - Reuben Likes, MD

## 2023-01-19 ENCOUNTER — Other Ambulatory Visit (INDEPENDENT_AMBULATORY_CARE_PROVIDER_SITE_OTHER): Payer: Self-pay | Admitting: Family Medicine

## 2023-01-19 DIAGNOSIS — E1165 Type 2 diabetes mellitus with hyperglycemia: Secondary | ICD-10-CM

## 2023-01-19 DIAGNOSIS — E559 Vitamin D deficiency, unspecified: Secondary | ICD-10-CM

## 2023-02-16 ENCOUNTER — Other Ambulatory Visit (INDEPENDENT_AMBULATORY_CARE_PROVIDER_SITE_OTHER): Payer: Self-pay | Admitting: Family Medicine

## 2023-02-16 DIAGNOSIS — E1165 Type 2 diabetes mellitus with hyperglycemia: Secondary | ICD-10-CM

## 2023-02-20 ENCOUNTER — Ambulatory Visit (INDEPENDENT_AMBULATORY_CARE_PROVIDER_SITE_OTHER): Payer: BC Managed Care – PPO | Admitting: Family Medicine

## 2023-04-13 ENCOUNTER — Other Ambulatory Visit (INDEPENDENT_AMBULATORY_CARE_PROVIDER_SITE_OTHER): Payer: Self-pay | Admitting: Family Medicine

## 2023-04-13 DIAGNOSIS — E559 Vitamin D deficiency, unspecified: Secondary | ICD-10-CM
